# Patient Record
Sex: Female | Born: 1970 | Race: Black or African American | Hispanic: No | Marital: Single | Smoking: Never smoker
Health system: Southern US, Community
[De-identification: ages and names within clinical notes are randomized; demographics above are authoritative.]

## PROBLEM LIST (undated history)

## (undated) DIAGNOSIS — F419 Anxiety disorder, unspecified: Secondary | ICD-10-CM

## (undated) DIAGNOSIS — F329 Major depressive disorder, single episode, unspecified: Secondary | ICD-10-CM

## (undated) DIAGNOSIS — K219 Gastro-esophageal reflux disease without esophagitis: Secondary | ICD-10-CM

## (undated) DIAGNOSIS — I10 Essential (primary) hypertension: Secondary | ICD-10-CM

## (undated) DIAGNOSIS — F32A Depression, unspecified: Secondary | ICD-10-CM

## (undated) DIAGNOSIS — B009 Herpesviral infection, unspecified: Secondary | ICD-10-CM

## (undated) HISTORY — PX: LASIK: SHX215

## (undated) HISTORY — PX: NASAL SEPTUM SURGERY: SHX37

---

## 1997-09-27 ENCOUNTER — Other Ambulatory Visit: Admission: RE | Admit: 1997-09-27 | Discharge: 1997-09-27 | Payer: Self-pay | Admitting: Obstetrics & Gynecology

## 1997-09-27 ENCOUNTER — Other Ambulatory Visit: Admission: RE | Admit: 1997-09-27 | Discharge: 1997-09-27 | Payer: Self-pay | Admitting: Obstetrics and Gynecology

## 1999-01-08 ENCOUNTER — Other Ambulatory Visit: Admission: RE | Admit: 1999-01-08 | Discharge: 1999-01-08 | Payer: Self-pay | Admitting: Obstetrics & Gynecology

## 2000-01-03 ENCOUNTER — Emergency Department (HOSPITAL_COMMUNITY): Admission: EM | Admit: 2000-01-03 | Discharge: 2000-01-03 | Payer: Self-pay | Admitting: Internal Medicine

## 2000-01-03 ENCOUNTER — Encounter: Payer: Self-pay | Admitting: Emergency Medicine

## 2000-01-15 ENCOUNTER — Encounter: Admission: RE | Admit: 2000-01-15 | Discharge: 2000-02-12 | Payer: Self-pay | Admitting: Orthopedic Surgery

## 2000-02-22 ENCOUNTER — Encounter: Payer: Self-pay | Admitting: Family Medicine

## 2000-02-22 ENCOUNTER — Ambulatory Visit (HOSPITAL_COMMUNITY): Admission: RE | Admit: 2000-02-22 | Discharge: 2000-02-22 | Payer: Self-pay | Admitting: Family Medicine

## 2002-04-08 ENCOUNTER — Emergency Department (HOSPITAL_COMMUNITY): Admission: EM | Admit: 2002-04-08 | Discharge: 2002-04-08 | Payer: Self-pay | Admitting: Emergency Medicine

## 2002-04-22 ENCOUNTER — Other Ambulatory Visit: Admission: RE | Admit: 2002-04-22 | Discharge: 2002-04-22 | Payer: Self-pay | Admitting: Obstetrics and Gynecology

## 2004-11-13 ENCOUNTER — Other Ambulatory Visit: Admission: RE | Admit: 2004-11-13 | Discharge: 2004-11-13 | Payer: Self-pay | Admitting: Obstetrics and Gynecology

## 2005-03-16 ENCOUNTER — Ambulatory Visit (HOSPITAL_COMMUNITY): Admission: RE | Admit: 2005-03-16 | Discharge: 2005-03-16 | Payer: Self-pay | Admitting: Obstetrics and Gynecology

## 2005-03-16 ENCOUNTER — Encounter (INDEPENDENT_AMBULATORY_CARE_PROVIDER_SITE_OTHER): Payer: Self-pay | Admitting: *Deleted

## 2005-11-14 ENCOUNTER — Other Ambulatory Visit: Admission: RE | Admit: 2005-11-14 | Discharge: 2005-11-14 | Payer: Self-pay | Admitting: Obstetrics and Gynecology

## 2006-12-02 ENCOUNTER — Ambulatory Visit (HOSPITAL_COMMUNITY): Admission: RE | Admit: 2006-12-02 | Discharge: 2006-12-02 | Payer: Self-pay | Admitting: Obstetrics and Gynecology

## 2007-01-16 ENCOUNTER — Inpatient Hospital Stay (HOSPITAL_COMMUNITY): Admission: AD | Admit: 2007-01-16 | Discharge: 2007-01-16 | Payer: Self-pay | Admitting: Obstetrics and Gynecology

## 2007-07-17 ENCOUNTER — Observation Stay (HOSPITAL_COMMUNITY): Admission: AD | Admit: 2007-07-17 | Discharge: 2007-07-18 | Payer: Self-pay | Admitting: Obstetrics and Gynecology

## 2007-09-01 ENCOUNTER — Inpatient Hospital Stay (HOSPITAL_COMMUNITY): Admission: AD | Admit: 2007-09-01 | Discharge: 2007-09-01 | Payer: Self-pay | Admitting: Obstetrics and Gynecology

## 2007-09-01 ENCOUNTER — Inpatient Hospital Stay (HOSPITAL_COMMUNITY): Admission: AD | Admit: 2007-09-01 | Discharge: 2007-09-04 | Payer: Self-pay | Admitting: Obstetrics and Gynecology

## 2007-09-08 ENCOUNTER — Inpatient Hospital Stay (HOSPITAL_COMMUNITY): Admission: AD | Admit: 2007-09-08 | Discharge: 2007-09-09 | Payer: Self-pay | Admitting: Obstetrics and Gynecology

## 2009-07-21 ENCOUNTER — Encounter: Admission: RE | Admit: 2009-07-21 | Discharge: 2009-07-21 | Payer: Self-pay | Admitting: Internal Medicine

## 2009-08-03 ENCOUNTER — Encounter: Admission: RE | Admit: 2009-08-03 | Discharge: 2009-08-03 | Payer: Self-pay | Admitting: Obstetrics and Gynecology

## 2010-06-10 ENCOUNTER — Encounter: Payer: Self-pay | Admitting: Obstetrics and Gynecology

## 2010-10-02 NOTE — H&P (Signed)
NAME:  Maria Reid, Maria Reid NO.:  [id number]   MEDICAL RECORD NO.:  [id number]          PATIENT TYPE:  OBV   LOCATION:  9155                          [facility]   PHYSICIAN:  Osborn Coho, M.D.   DATE OF BIRTH:  1971-01-07   DATE OF ADMISSION:  07/17/2007  DATE OF DISCHARGE:                              HISTORY & PHYSICAL   Maria Reid is a 40 year old gravida 2, para 0-0-1-0 at 33-6/7 weeks who  presented to maternity admissions unit complaining of nausea, vomiting  and diarrhea since 2 a.m. yesterday.  She has been unable to keep any  food or fluids down.  She had a temperature of 100.4 at home.  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 She  reported occasional sharp pains and reported positive fetal movement.   While in maternity admissions unit, she did receive IV hydration with  fluids with Phenergan.  However, she still maintained several episodes  of vomiting.  She also had some fetal tachycardia although the fetal  heart rate was reactive.  In light of these findings, the decision was  made to findings, the decision was made to admit her for 23-hour  observation.   Pregnancy has been remarkable for:  1. Allergies to PENICILLIN, SULFA and ASPIRIN.  2. History of cryosurgery.  3. Advanced maternal age.  4. History of asthma.  5. Rubella equivocal.  6. History of genital herpes.  No recent or current lesions.  7. Elevated 1-hour GTT with one abnormal 3-hours values.  However, the      patient has been monitoring her CBG's at home and they have been      within normal limits.   PRENATAL LABS:  Blood type is A+, Rh antibody negative, VDRL  nonreactive, rubella is equivocal.  Hepatitis B surface antigen is  negative.  Sickle cell test was negative.  HIV nonreactive.  Hemoglobin  upon entering the practice is 11.9, was 11.6 at 28 weeks.  A 4-hour  Glucola was 161.  Her 3-hour GTT had one abnormal values.  TSH was  1.854.  This was done secondary to a lack of awareness and slurring her  words.   HISTORY OF PRESENT PREGNANCY:  The patient entered care at approximately  7 weeks.  She had an ultrasound at that time.  This was done at 7 weeks.  There were some polyps seen.  There was a follow-up ultrasound done at 7  weeks showing an [facility] of September 02, 2007 which was consistent with [facility] of  August 29, 2007 by LMP.  At her new OB visit,  she was treated for BV.  She had another viability ultrasound at that time.  She declined first  trimester screening.  She has sinus congestion early pregnancy.  She had  an anatomy screen at 19 weeks showing normal growth.  There were some  limitations of cardiac anatomy.  Follow-up was planned in two weeks with  all follow-up seen.  She had some groin pain at 21 weeks.  At 28 weeks,  she saw Dr. Su Hilt  and had a Glucola.  At that time she complained of a  lack of awareness at times, episodically, and friends saying the patient  was slurring her words.  TSH was done that was normal.  Referral to a  neurologist was accomplished. One-hour Glucola was at 161 at that time  and a 3-hour GTT was done that had one abnormal value.  She had a  neurological appointment on February 16.  Their work-up was negative.  They had some recommendations for an MRI after delivery to rule out  transient global amnesia.  The patient also has been monitoring her  CBGs.  All have been normal and she has been following her mother's  diabetic diet to keep her blood sugars stable.  She was measuring  slightly size larger than dates and plan was made for ultrasound at her  next visit.  She then presented today with the history of almost 24  hours of nausea, vomiting and diarrhea.   OBSTETRICAL HISTORY:  In 2005 she had a 7-8 week termination of  pregnancy.   MEDICAL HISTORY:  1. In 1990 she had an abnormal Pap smear.  She had cryosurgery with      warts and a colposcopy.  Paps have been normal  since then.  2.  She      had Chlamydia in 1990.  2. She was diagnosed with herpes in 1992 and has had outbreaks two to      three times a year.  3. She has had  Trichomonas in 1990.  4. 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  5. She reports usual childhood illnesses.  6. Frequent yeast infections.  7. She does have a history of a questionable heart murmur.  However,      this has not been noted during her pregnancy.  8. Since age 25 she has had asthma and has been followed by Dr.      Regent Callas, but currently is not using any routine medications.  9. She was noted in 2000 to have some depression.   PAST SURGICAL HISTORY:  1. Surgical history includes wisdom teeth in 2008.  2. Termination of pregnancy in 2005 with a D&C.  3. She had polyps removed in 2006.   ALLERGIES:  She is allergic to PENICILLIN, SULFA, ASPIRIN.   FAMILY HISTORY:  Her father had an MI and a stent placed.  Her father  and maternal grandmother have chronic hypertension.  Her maternal  grandmother had COPD.  Her mother, father and maternal grandmother have  diabetes.  Her maternal grandmother had seizures and a stroke.  Her  maternal grandmother had rheumatoid arthritis.  Paternal grandfather had  prostate.  Her sister, maternal grandmother, maternal grandmother all  have depression.   GENETIC HISTORY:  Remarkable for the patient's age of 71.  The father of  the baby's sister is a deaf mute.  The patient's first cousin is  mentally retarded.  The patient's aunt had twins and father of baby is a  twin.   SOCIAL HISTORY:  The patient is single.  Father of baby has been  involved but is not present with her today.  His name is Social research officer, government.  The patient is of the [employer] faith.  She is Tree surgeon.  She is  graduate educated.  She is a Engineer, civil (consulting).  Her partner has a 1 year of  college.  He is employed in business.  She has been followed by the  certified nurse midwife service [facility].  She denies any   alcohol, drug or tobacco use during this pregnancy.   PHYSICAL EXAMINATION:  VITAL SIGNS:  Stable.  Pulse is 114 to 119.  Respiration 20, temperature is 98.6.  Blood pressure was 110/60.  Fetal  heart tones are reactive.  However, there are segments of tachycardia.  There is some occasional mild variables noted.  These are not  repetitive.  The  patient initially had contractions every 3 to 6  minutes, but a negative spontaneous CST.  She was minimally aware of the  those.  Fetal fibronectin was done which was negative.  PELVIC:  Cervix was long and closed with the vertex at -2 station.  ABDOMEN:  Soft and nontender.  No rebound or guarding.  Negative CVA  tenderness noted.  EXTREMITIES:  Deep tendon reflexes are 2+ without clonus.  There is a  trace edema noted.   LABORATORY DATA:  Urinalysis shows a specific gravity greater than  1.030, greater than 80 ketones and 30 mg of protein.  CBC shows a  hemoglobin 11.6, hematocrit of 33.8.  White blood cell count of 13.7 and  platelet count of 205.  Neutrophil count is 88%.  Comprehensive  metabolic panel shows all results within normal limits except for a  mildly low potassium at 3.2.  Amylase and lipase are currently pending.   ASSESSMENT:  1. Intrauterine pregnancy at 33-6/7 weeks.  2. Probable viral syndrome.  3. Sporadic fetal tachycardia  4. Maternal tachycardia, probably secondary to dehydration.   PLAN:  1. Admit to antenatal unit per Dr. Su Hilt consult for 23-hour      observation.  2. Continue IV hydration with LR at 250 mL per hour.  3. Zofran 4 mg IV q.8 h.  4. Maintain clear liquids if tolerated.  5. Will reevaluate in the morning.      Renaldo Reel Emilee Hero, C.N.M.      Osborn Coho, M.D.  Electronically Signed    VLL/MEDQ  D:  07/17/2007  T:  07/18/2007  Job:  [id number]

## 2010-10-02 NOTE — H&P (Signed)
NAME:  Maria Reid, Maria Reid NO.:  [id number]   MEDICAL RECORD NO.:  [id number]          PATIENT TYPE:  INP   LOCATION:  9166                          [facility]   PHYSICIAN:  Osborn Coho, M.D.   DATE OF BIRTH:  05-11-1971   DATE OF ADMISSION:  09/01/2007  DATE OF DISCHARGE:                              HISTORY & PHYSICAL   This is a 40 year old gravida 2, para 0-0-1-0 at 40-3/7 weeks who  presents for labor evaluation. She was seen earlier this morning with no  change and service and sent home.  She came back later on and over the  course of 4 hours progressed from 1-2 cm to 2+ centimeters. Biophysical  profile was 6/8 with amniotic fluid pocket of 10.12.  Pregnancy has been  followed by the nurse midwife service, but the patient now requests  physician delivery and her pregnancy has been remarkable for:  1. History of abnormal Pap  2. History of HSV II.  3. History of HPV.  4. History of asthma.  5. Equivocal rubella.  6. History of cryosurgery.  7. AMA.  8. Group B strep negative.   ALLERGIES:  PENICILLIN, SULFA AND ASPIRIN.   OB HISTORY:  Is remarkable for an elective abortion in 2005.   MEDICAL HISTORY:  1. Is remarkable for abnormal Pap with cryosurgery in 1990.  2. History of chlamydia in 1990.  3. HSV II since 1992.  4. Trichomonas in 1990.  5. Childhood varicella.  6. History of asthma.  7. History of some kind of psychiatric disease in the year 2000.   SURGICAL HISTORY:  1. Is remarkable for elective abortion.  2. Wisdom teeth.   FAMILY HISTORY:  Is remarkable for father with myocardial infarction,  father and mother with hypertension, grandmother with COPD, mother and  father and grandmother with diabetes, grandmother with seizures and  arthritis, grandfather with prostate cancer.   GENETIC HISTORY:  Is remarkable for father of the baby, sister with  deafness. Cousin with mental retardation and aunt with twins.   SOCIAL HISTORY:  The  patient is single.  Father of baby Garret Reddish  is not currently present with the patient.  The patient works as a  Engineer, civil (consulting).  She is of the [employer].  She denies any alcohol, tobacco or  drug use.   PRENATAL LABS:  Hemoglobin 11.9, platelets 292.  Hepatitis negative, RPR  nonreactive.  Blood type A+, antibody screen negative, sickle cell  negative, rubella equivocal, HIV negative.   HISTORY OF CURRENT PREGNANCY:  The patient entered care at [redacted] weeks  gestation.  She was seen for first trimester spotting and ultrasound was  done at 7 weeks and was normal. She was treated for BV at 11 weeks. She  declined first trimester screen.  Anatomy ultrasound at 19 weeks was  normal. She had a fall at 21 weeks which gave her groin pain. Glucola  was elevated at 28 weeks; 3-hour GTT showed one abnormal value. She had  an episode of slurred speech at 28 weeks and saw neurologist for that  who recommended MRI.  She  has swelling in her legs at 34 weeks and  stopped work. Group B strep was negative at term.   OBJECTIVE:  VITAL SIGNS: Stable, afebrile.  HEENT: Within normal limits.  Thyroid normal not enlarged.  CHEST: Clear to auscultation.  HEART: Regular rate and rhythm.  ABDOMEN: Gravid, vertex Leopold's. EFM shows reactive fetal heart rate  with contractions every 3-4 minutes.  Cervix is 2+ centimeters,  completely effaced, -1, -2 station with a vertex presentation. BPP is  6/8. Amniotic fluid pocket 10.12.  PELVIC: Negative for HSV lesions.  EXTREMITIES:  Within normal limits.   ASSESSMENT:  1. Intrauterine pregnancy at 40-3/7 weeks.  2. Early labor can.   PLAN:  1. Admit to birthing suites per Dr. Su Hilt.  2. Routine MD orders.  3. Analgesia p.r.n.      Marie L. Williams, C.N.M.      Osborn Coho, M.D.  Electronically Signed    MLW/MEDQ  D:  09/01/2007  T:  09/01/2007  Job:  [id number]

## 2010-10-02 NOTE — Discharge Summary (Signed)
NAMECHARLESTON, Maria Reid NO.:  [id number]   MEDICAL RECORD NO.:  [id number]          PATIENT TYPE:  OBV   LOCATION:  9155                          [facility]   PHYSICIAN:  Osborn Coho, M.D.   DATE OF BIRTH:  February 19, 1971   DATE OF ADMISSION:  07/17/2007  DATE OF DISCHARGE:                               DISCHARGE SUMMARY   ADMISSION DIAGNOSES:  1. Intrauterine pregnancy at 33 weeks and 6 days.  2. Gastroenteritis.  3. Dehydration.   DISCHARGE DIAGNOSES:  1. Intrauterine pregnancy at 69 and 0/7th weeks.  2. Dehydration,  resolved.  3. Gastroenteritis, resolved.   PROCEDURE:  IV fluid hydration.   HOSPITAL COURSE:  Ms. Goin is a 40 year old gravida 2, para 0-0-1-0,  who presents to [facility]U at 40 and 7th weeks with complaints of persistent  nausea and vomiting.  Patient's pregnancy remarkable for:  1. Advanced maternal age.  2. ALLERGIES TO PENICILLIN, SULFA, ASPIRIN.  3. History of cervical cryo surgery.  4. History of asthma.  5. Rubella equivocal.  6. History of genital Herpes.  7. Increased 1 hour glucose tolerance test with 1 abnormal 3 hour      value.   Upon presentation to Maternity Admissions, patient with recurrent nausea  and vomiting since 2 a.m.,  July 17, 2007. Patient with no known  exposure. 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 The patient  reports her fetus had been moving normally and denied uterine  contractions, leakage, or fluid. On admission, the patient's urine was  significant for a specific gravity greater than 1.030, greater than 80  mg of ketones/dL, 30 mg of protein/dL as well. The patient was given IV  fluids in Maternity Admissions as well as Zofran V. Fetal fibronectin  was also obtained which was negative due to occasional contractions  noted on ESM. Fetal heart rate was reactive while in Maternity  Admissions with occasional variables. The patient's CBC with white count  of 13.7, hemoglobin 11.6,  platelets 205,000. Despite IV fluids and  Zofran, patient with persistent vomiting, therefore was admitted for 23-  hour observation and continued IV fluids. Again, as noted above, fetal  fibronectin obtained and negative. Upon admission, the patient's  metabolic panel with sodium 137, potassium 3.2, liver functions within  normal limits. Amylase and lipase were also obtained and were within  normal limits as well. On hospital day 1, patient reported with no  further vomiting since admission. The patient reported that she did have  1 loose stool; however, no recurrent diarrhea. The patient reported  feeling much better. Due to variable decelerations noted on EFM during  the night, biophysical profile and AFI were done.  Amniotic fluid index  1.72 cm and the biophysical profile 6 out of 8 with 2 off for fetal  breathing motion. Throughout the morning, fetal heart rate remained  reactive with variable decelerations resolved. No further uterine  contractions noted on toto __________ . The patient tolerated a clear  liquid breakfast and a regular diet at lunchtime and requesting  discharge. It was felt that patient had received full benefit of  her  stay and was discharged to home.   DISCHARGE INSTRUCTIONS PER ANTEPARTUM DISCHARGE HANDOUT:  Preterm labor  instructions reviewed as well as patient was encouraged to maintain a  bland diet until complete resolution of GI signs and symptoms.   DISCHARGE MEDICATIONS:  1. Prenatal vitamin daily.  2. 10 mg of Singulair.  3. Acyclovir 400 mg b.i.d.  4. Prozac 20 mg q.h.s.  5. Claritin q.a.m.  6. Benadryl 25 mg q.h.s.  7. Zantac 150 mg b.i.d.   DISCHARGE FOLLOWUP:  Patient to return to the office for her regularly  scheduled return OB visit within the next week with ultrasound for  growth as scheduled. The patient was instructed to call if she has  questions or concerns prior to the next visit. The patient was also  instructed to call if she has  fever or recurrent symptoms.      Rhona Leavens, CNM      Osborn Coho, M.D.  Electronically Signed    NOS/MEDQ  D:  07/18/2007  T:  07/19/2007  Job:  [id number]

## 2010-10-05 NOTE — H&P (Signed)
NAME:  Maria Reid, SOBERANIS NO.:  [id number]   MEDICAL RECORD NO.:  [id number]          PATIENT TYPE:  AMB   LOCATION:  SDC                           [facility]   PHYSICIAN:  Janine Limbo, M.D.DATE OF BIRTH:  [birth year]   DATE OF ADMISSION:  03/16/2005  DATE OF DISCHARGE:                                HISTORY & PHYSICAL   HISTORY OF PRESENT ILLNESS:  Maria Reid is a 40 year old female, para 0-0-1-  0, who presents for hysteroscopy with dilatation and curettage.  The patient  has been followed at the [facility] and Gynecology division  of [facility].  The patient has a history of  menorrhagia.  A saline infusion sonogram was performed that showed an  endometrial mass consistent with a polyp.   OBSTETRICAL HISTORY:  The patient had an elective pregnancy termination  performed in January 2006.   PAST MEDICAL HISTORY:  The patient denies hypertension and diabetes.  She  has a history of asthma, and she has depression.   CURRENT MEDICATIONS:  1.  Singulair 10 mg q.h.s.  2.  Claritin 10 mg daily.  3.  Nasal spray, Nasocort, weekly allergy shots, multivitamins, and BC      Powders.   DRUG ALLERGIES:  The patient reports being allergic to PENICILLIN, ASPIRIN,  and SULFA medications.   SOCIAL HISTORY:  The patient denies cigarette use and recreational drug use  other than social alcohol.   REVIEW OF SYSTEMS:  Noncontributory.   FAMILY HISTORY:  The patient's grandmother has diabetes and seizures.  The  patient's mother has ulcers and hypertension.  The patient's father has  rheumatic fever, high blood pressure, and heart disease.   PHYSICAL EXAMINATION:  VITAL SIGNS:  Weight is 186 pounds.  Height is 5 feet  7 inches.  HEENT:  Within normal limits.  CHEST:  Clear.  HEART:  Regular rate and rhythm.  BREASTS:  Without masses.  ABDOMEN:  Nontender.  NEUROLOGIC:  Grossly normal.  EXTREMITIES:  Grossly normal.   PELVIC EXAM:  External genitalia is normal.  The vagina is normal.  The  cervix is nontender.  The uterus is normal size, shape, and consistency.  Adnexa:  No masses.   ASSESSMENT:  1.  Irregular bleeding.  2.  Endometrial polyp.   PLAN:  The patient will undergo a hysteroscopy with dilatation and  curettage.  She understands the indications for her procedure and she  accepts the risks of, but not limited to, anesthetic complications,  bleeding, infections, and possible damage to the surrounding organs.      Janine Limbo, M.D.  Electronically Signed     AVS/MEDQ  D:  03/15/2005  T:  03/15/2005  Job:  [id number]   cc:   Renaye Rakers, M.D.  Fax: [phone]

## 2010-10-05 NOTE — Op Note (Signed)
[name], YIM NO.:  [id number]   MEDICAL RECORD NO.:  [id number]          PATIENT TYPE:  AMB   LOCATION:  SDC                           [facility]   PHYSICIAN:  Maria Reid, M.D.DATE OF BIRTH:  [birth year]   DATE OF PROCEDURE:  03/16/2005  DATE OF DISCHARGE:                                 OPERATIVE REPORT   PREOPERATIVE DIAGNOSES:  1.  Irregular uterine bleeding.  2.  Endometrial polyp.   POSTOPERATIVE DIAGNOSES:  1.  Irregular uterine bleeding.  2.  Endometrial polyp.   PROCEDURE:  1.  Hysteroscopy.  2.  Hysteroscopic resection of endometrial polyp.  3.  Dilatation and curettage.   SURGEON:  Dr. Leonard Reid.   ANESTHETIC:  General.   DISPOSITION:  Maria Reid is a 40 year old female, para 0-0-1-0, who  presents with the above-mentioned diagnoses. The patient understands the  indications for procedure, and she accepts the risk of, but not limited to,  anesthetic complications, bleeding, infection, and possible damage to  surrounding organs.   FINDINGS:  The uterus sounded to 8.5 cm. There was an endometrial polyp  present at the fundus of the uterus that measured less than a centimeter in  size. No other pathology was noted. No adnexal masses were appreciated on  exam under anesthesia.   PROCEDURE:  The the patient was taken to the operating room where a general  anesthetic was given. The patient's lower abdomen, perineum, and vagina were  prepped with multiple layers of Betadine. The bladder was drained of urine.  Examination under anesthesia was performed. The patient was then sterilely  draped. A paracervical block was placed using 10 cc of half percent Marcaine  with epinephrine. An endocervical curettage was then performed. The uterus  sounded to 8.5 cm. The cervix was gradually dilated. The diagnostic  hysteroscope was inserted, and the cavity was carefully inspected. Pictures  were taken. The patient was noted to  have a small polyp measuring less than  a centimeter in size at the fundus of the uterus. The diagnostic  hysteroscope was removed, and the cervix was dilated even further. The  operative hysteroscope was then inserted, and a single loop was used to  resect the polyp from the fundus of the uterus. Care was taken not to  penetrate the uterus. Hemostasis was adequate. The cavity was then curetted  using a medium sharp curette. The cavity was felt to be clean at the end of  our procedure. All incisions were removed. Again, hemostasis was confirmed.  Repeat exam was performed, and the uterus was noted to be firm and without  apparent damage. Sponge, needle, and instrument counts were correct on two  occasions. The estimated blood loss for the procedure was 20 cc. The patient  tolerated her procedure well. The fluid deficit was 150 cc, although there  was a large amount of fluid noted on the floor and also a large amount of  fluid noted on the operator's gown. The patient was noted to be stable when  she was taken to the recovery room.   FOLLOW-UP INSTRUCTIONS:  The  patient was given a prescription for Vicodin,  and she will take 1 tablet or 2 tablets every 4 hours as needed for pain.  She will return to see Dr. Stefano Reid in three weeks for follow-up  examination. She was given a copy of the postoperative instruction sheet as  prepared by the [facility] for patients with undergone a  dilatation and curettage.      Maria Reid, M.D.  Electronically Signed     AVS/MEDQ  D:  03/16/2005  T:  03/16/2005  Job:  [id number]

## 2011-02-08 LAB — URINALYSIS, ROUTINE W REFLEX MICROSCOPIC
Glucose, UA: NEGATIVE
Leukocytes, UA: NEGATIVE
Nitrite: NEGATIVE
Specific Gravity, Urine: >1.03 — ABNORMAL HIGH
pH: 5.5

## 2011-02-08 LAB — COMPREHENSIVE METABOLIC PANEL
Alkaline Phosphatase: 124 — ABNORMAL HIGH
BUN: 6
Creatinine, Ser: 0.84
Glucose, Bld: 166 — ABNORMAL HIGH
Potassium: 3.2 — ABNORMAL LOW
Total Protein: 5.6 — ABNORMAL LOW

## 2011-02-08 LAB — DIFFERENTIAL
Basophils Absolute: 0
Basophils Relative: 0
Lymphocytes Relative: 7 — ABNORMAL LOW
Monocytes Relative: 6
Neutro Abs: 12 — ABNORMAL HIGH
Neutrophils Relative %: 88 — ABNORMAL HIGH

## 2011-02-08 LAB — CBC
HCT: 33.8 — ABNORMAL LOW
Hemoglobin: 11.6 — ABNORMAL LOW
MCHC: 34.2
MCV: 96.1
RDW: 13.3

## 2011-02-08 LAB — URINE MICROSCOPIC-ADD ON

## 2011-02-08 LAB — AMYLASE: Amylase: 115

## 2011-02-12 LAB — CBC
HCT: 34.2 — ABNORMAL LOW
HCT: 38
Hemoglobin: 11.8 — ABNORMAL LOW
Hemoglobin: 13
MCHC: 34.2
MCV: 96.6
RBC: 4.1
RBC: 3.93
WBC: 20.7 — ABNORMAL HIGH
WBC: 18.3 — ABNORMAL HIGH

## 2011-02-12 LAB — LACTATE DEHYDROGENASE: LDH: 166

## 2011-02-12 LAB — COMPREHENSIVE METABOLIC PANEL
ALT: 35
CO2: 25
Calcium: 9.3
Creatinine, Ser: 1.02
GFR calc non Af Amer: >60
Glucose, Bld: 88

## 2011-02-12 LAB — URINALYSIS, ROUTINE W REFLEX MICROSCOPIC
Glucose, UA: NEGATIVE
Leukocytes, UA: NEGATIVE
Nitrite: NEGATIVE
pH: 5.5

## 2011-02-12 LAB — RPR: RPR Ser Ql: NONREACTIVE

## 2011-02-12 LAB — URINE MICROSCOPIC-ADD ON

## 2011-03-01 LAB — GC/CHLAMYDIA PROBE AMP, GENITAL: GC Probe Amp, Genital: NEGATIVE

## 2011-03-01 LAB — WET PREP, GENITAL

## 2011-06-29 DIAGNOSIS — J45909 Unspecified asthma, uncomplicated: Secondary | ICD-10-CM | POA: Insufficient documentation

## 2011-06-29 DIAGNOSIS — B009 Herpesviral infection, unspecified: Secondary | ICD-10-CM | POA: Insufficient documentation

## 2011-06-29 DIAGNOSIS — N840 Polyp of corpus uteri: Secondary | ICD-10-CM | POA: Insufficient documentation

## 2011-09-17 ENCOUNTER — Encounter (HOSPITAL_COMMUNITY): Payer: Self-pay | Admitting: Emergency Medicine

## 2011-09-17 ENCOUNTER — Emergency Department (HOSPITAL_COMMUNITY)
Admission: EM | Admit: 2011-09-17 | Discharge: 2011-09-17 | Disposition: A | Discharge status: Home / Self Care | Payer: Managed Care, Other (non HMO) | Source: Home / Self Care | Attending: Family Medicine | Admitting: Family Medicine

## 2011-09-17 DIAGNOSIS — N760 Acute vaginitis: Secondary | ICD-10-CM

## 2011-09-17 LAB — WET PREP, GENITAL: Yeast Wet Prep HPF POC: NONE SEEN

## 2011-09-17 LAB — POCT URINALYSIS DIP (DEVICE)
Bilirubin Urine: NEGATIVE
Glucose, UA: NEGATIVE mg/dL
Ketones, ur: NEGATIVE mg/dL
Leukocytes, UA: NEGATIVE
Nitrite: NEGATIVE
pH: 5.5 (ref 5.0–8.0)

## 2011-09-17 NOTE — Discharge Instructions (Signed)
We will call with test results and treat as indicated °

## 2011-09-17 NOTE — ED Provider Notes (Signed)
History     CSN: [id number]  Arrival date & time 09/17/11  4098   First MD Initiated Contact with Patient 09/17/11 [phone]      Chief Complaint  Patient presents with  . Vaginal Discharge    (Consider location/radiation/quality/duration/timing/severity/associated sxs/prior treatment) Patient is a 41 y.o. female presenting with vaginal discharge. The history is provided by the patient.  Vaginal Discharge This is a new problem. The current episode started more than 1 week ago. The problem has not changed since onset.Pertinent negatives include no chest pain, no abdominal pain, no headaches and no shortness of breath. Associated symptoms comments: H/o BV years ago..    Past Medical History  Diagnosis Date  . Asthma     History reviewed. No pertinent past surgical history.  No family history on file.  History  Substance Use Topics  . Smoking status: Never Smoker   . Smokeless tobacco: Not on file  . Alcohol Use: Yes    OB History    Grav Para Term Preterm Abortions TAB SAB Ect Mult Living                  Review of Systems  Constitutional: Negative.   Respiratory: Negative for shortness of breath.   Cardiovascular: Negative for chest pain.  Gastrointestinal: Negative for abdominal pain.  Genitourinary: Positive for vaginal discharge. Negative for dysuria, urgency, frequency, vaginal bleeding, vaginal pain and pelvic pain.  Neurological: Negative for headaches.    Allergies  Aspirin; Penicillins; and Sulfa drugs cross reactors  Home Medications   Current Outpatient Rx  Name Route Sig Dispense Refill  . ALBUTEROL SULFATE HFA 108 (90 BASE) MCG/ACT IN AERS Inhalation Inhale 2 puffs into the lungs every 6 (six) hours as needed.    Marland Kitchen CALCIUM CARBONATE ANTACID 500 MG PO CHEW Oral Chew 1 tablet by mouth daily.    Marland Kitchen DIPHENHYDRAMINE HCL 25 MG PO TABS Oral Take 25 mg by mouth every 6 (six) hours as needed.    Marland Kitchen LEVONORGESTREL 20 MCG/24HR IU IUD Intrauterine 1 each by  Intrauterine route once.    Marland Kitchen MONTELUKAST SODIUM 10 MG PO TABS Oral Take 10 mg by mouth at bedtime.      BP 116/81  Pulse 80  Temp(Src) 98.5 F (36.9 C) (Oral)  Resp 16  SpO2 100%  LMP 08/28/2011  Physical Exam  Nursing note and vitals reviewed. Constitutional: She appears well-developed and well-nourished.  Abdominal: Soft. Bowel sounds are normal. There is no tenderness.  Genitourinary: Vagina normal and uterus normal. No vaginal discharge found.  Skin: Skin is warm and dry.    ED Course  Procedures (including critical care time)  Labs Reviewed  POCT URINALYSIS DIP (DEVICE) - Abnormal; Notable for the following:    Hgb urine dipstick MODERATE (*)    All other components within normal limits  POCT PREGNANCY, URINE  GC/CHLAMYDIA PROBE AMP, GENITAL  WET PREP, GENITAL   No results found.   1. Vaginitis       MDM          Linna Hoff, MD 09/17/11 [phone]

## 2011-09-17 NOTE — ED Notes (Signed)
Pt here with c/o vag clear d/c with odor,dk concentrated urine that started last week.no fever, back or abd pain noted.pt states she has past BV AND TREATED by doctor with boric acid supp which cleared infection up

## 2011-09-18 LAB — GC/CHLAMYDIA PROBE AMP, GENITAL
Chlamydia, DNA Probe: NEGATIVE
GC Probe Amp, Genital: NEGATIVE

## 2013-05-29 ENCOUNTER — Encounter (HOSPITAL_COMMUNITY): Payer: Self-pay | Admitting: Emergency Medicine

## 2013-05-29 ENCOUNTER — Emergency Department (HOSPITAL_COMMUNITY)
Admission: EM | Admit: 2013-05-29 | Discharge: 2013-05-29 | Disposition: A | Discharge status: Home / Self Care | Payer: Managed Care, Other (non HMO) | Attending: Emergency Medicine | Admitting: Emergency Medicine

## 2013-05-29 DIAGNOSIS — Z79899 Other long term (current) drug therapy: Secondary | ICD-10-CM | POA: Insufficient documentation

## 2013-05-29 DIAGNOSIS — K219 Gastro-esophageal reflux disease without esophagitis: Secondary | ICD-10-CM | POA: Insufficient documentation

## 2013-05-29 DIAGNOSIS — R071 Chest pain on breathing: Secondary | ICD-10-CM | POA: Insufficient documentation

## 2013-05-29 DIAGNOSIS — M549 Dorsalgia, unspecified: Secondary | ICD-10-CM | POA: Insufficient documentation

## 2013-05-29 DIAGNOSIS — R51 Headache: Secondary | ICD-10-CM | POA: Insufficient documentation

## 2013-05-29 DIAGNOSIS — IMO0001 Reserved for inherently not codable concepts without codable children: Secondary | ICD-10-CM | POA: Insufficient documentation

## 2013-05-29 DIAGNOSIS — Y92410 Unspecified street and highway as the place of occurrence of the external cause: Secondary | ICD-10-CM | POA: Insufficient documentation

## 2013-05-29 DIAGNOSIS — Y9389 Activity, other specified: Secondary | ICD-10-CM | POA: Insufficient documentation

## 2013-05-29 DIAGNOSIS — J45909 Unspecified asthma, uncomplicated: Secondary | ICD-10-CM | POA: Insufficient documentation

## 2013-05-29 DIAGNOSIS — Z880 Allergy status to penicillin: Secondary | ICD-10-CM | POA: Insufficient documentation

## 2013-05-29 DIAGNOSIS — IMO0002 Reserved for concepts with insufficient information to code with codable children: Secondary | ICD-10-CM | POA: Insufficient documentation

## 2013-05-29 HISTORY — DX: Gastro-esophageal reflux disease without esophagitis: K21.9

## 2013-05-29 MED ORDER — IBUPROFEN 800 MG PO TABS: 800.0000 mg | ORAL_TABLET | Freq: Three times a day (TID) | ORAL | Status: AC

## 2013-05-29 MED ORDER — CYCLOBENZAPRINE HCL 10 MG PO TABS: 10.0000 mg | ORAL_TABLET | Freq: Two times a day (BID) | ORAL | Status: DC | PRN
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
## 2013-05-29 NOTE — ED Provider Notes (Signed)
Medical screening examination/treatment/procedure(s) were performed by non-physician practitioner and as supervising physician I was immediately available for consultation/collaboration.   Shanna CiscoMegan E Yandriel Boening, MD 05/29/13 [phone]

## 2013-07-06 ENCOUNTER — Encounter (HOSPITAL_BASED_OUTPATIENT_CLINIC_OR_DEPARTMENT_OTHER): Payer: Self-pay | Admitting: Emergency Medicine

## 2013-07-06 ENCOUNTER — Emergency Department (HOSPITAL_BASED_OUTPATIENT_CLINIC_OR_DEPARTMENT_OTHER)
Admission: EM | Admit: 2013-07-06 | Discharge: 2013-07-06 | Disposition: A | Discharge status: Home / Self Care | Payer: Managed Care, Other (non HMO) | Attending: Emergency Medicine | Admitting: Emergency Medicine

## 2013-07-06 DIAGNOSIS — R110 Nausea: Secondary | ICD-10-CM | POA: Insufficient documentation

## 2013-07-06 DIAGNOSIS — K219 Gastro-esophageal reflux disease without esophagitis: Secondary | ICD-10-CM | POA: Insufficient documentation

## 2013-07-06 DIAGNOSIS — Z791 Long term (current) use of non-steroidal anti-inflammatories (NSAID): Secondary | ICD-10-CM | POA: Insufficient documentation

## 2013-07-06 DIAGNOSIS — Z880 Allergy status to penicillin: Secondary | ICD-10-CM | POA: Insufficient documentation

## 2013-07-06 DIAGNOSIS — J45909 Unspecified asthma, uncomplicated: Secondary | ICD-10-CM | POA: Insufficient documentation

## 2013-07-06 DIAGNOSIS — Z79899 Other long term (current) drug therapy: Secondary | ICD-10-CM | POA: Insufficient documentation

## 2013-07-06 DIAGNOSIS — I10 Essential (primary) hypertension: Secondary | ICD-10-CM | POA: Insufficient documentation

## 2013-07-06 LAB — BASIC METABOLIC PANEL
BUN: 12 mg/dL (ref 6–23)
CO2: 27 mEq/L (ref 19–32)
Calcium: 9.5 mg/dL (ref 8.4–10.5)
Chloride: 101 mEq/L (ref 96–112)
Creatinine, Ser: 0.9 mg/dL (ref 0.50–1.10)
GFR, EST NON AFRICAN AMERICAN: 78 mL/min — AB (ref >90)
Glucose, Bld: 99 mg/dL (ref 70–99)
POTASSIUM: 4.3 meq/L (ref 3.7–5.3)
SODIUM: 138 meq/L (ref 137–147)

## 2013-07-06 MED ORDER — HYDROCHLOROTHIAZIDE 25 MG PO TABS: 25.0000 mg | ORAL_TABLET | Freq: Every day | ORAL | Status: DC

## 2013-07-06 MED ORDER — ONDANSETRON 4 MG PO TBDP
4.0000 mg | ORAL_TABLET | Freq: Once | ORAL | Status: AC
  Administered 2013-07-06: 4 mg via ORAL
  Filled 2013-07-06: qty 1

## 2013-07-06 NOTE — Discharge Instructions (Signed)
Arterial Hypertension  Arterial hypertension (high blood pressure) is a condition of elevated pressure in your blood vessels. Hypertension over a long period of time is a risk factor for strokes, heart attacks, and heart failure. It is also the leading cause of kidney (renal) failure.   CAUSES   · In Adults -- Over 90% of all hypertension has no known cause. This is called essential or primary hypertension. In the other 10% of people with hypertension, the increase in blood pressure is caused by another disorder. This is called secondary hypertension. Important causes of secondary hypertension are:  · Heavy alcohol use.  · Obstructive sleep apnea.  · Hyperaldosterosim (Conn's syndrome).  · Steroid use.  · Chronic kidney failure.  · Hyperparathyroidism.  · Medications.  · Renal artery stenosis.  · Pheochromocytoma.  · Cushing's disease.  · Coarctation of the aorta.  · Scleroderma renal crisis.  · Licorice (in excessive amounts).  · Drugs (cocaine, methamphetamine).  Your caregiver can explain any items above that apply to you.  · In Children -- Secondary hypertension is more common and should always be considered.  · Pregnancy -- Few women of childbearing age have high blood pressure. However, up to 10% of them develop hypertension of pregnancy. Generally, this will not harm the woman. It may be a sign of 3 complications of pregnancy: preeclampsia, HELLP syndrome, and eclampsia. Follow up and control with medication is necessary.  SYMPTOMS   · This condition normally does not produce any noticeable symptoms. It is usually found during a routine exam.  · Malignant hypertension is a late problem of high blood pressure. It may have the following symptoms:  · Headaches.  · Blurred vision.  · End-organ damage (this means your kidneys, heart, lungs, and other organs are being damaged).  · Stressful situations can increase the blood pressure. If a person with normal blood pressure has their blood pressure go up while being  seen by their caregiver, this is often termed "white coat hypertension." Its importance is not known. It may be related with eventually developing hypertension or complications of hypertension.  · Hypertension is often confused with mental tension, stress, and anxiety.  DIAGNOSIS   The diagnosis is made by 3 separate blood pressure measurements. They are taken at least 1 week apart from each other. If there is organ damage from hypertension, the diagnosis may be made without repeat measurements.  Hypertension is usually identified by having blood pressure readings:  · Above 140/90 mmHg measured in both arms, at 3 separate times, over a couple weeks.  · Over 130/80 mmHg should be considered a risk factor and may require treatment in patients with diabetes.  Blood pressure readings over 120/80 mmHg are called "pre-hypertension" even in non-diabetic patients.  To get a true blood pressure measurement, use the following guidelines. Be aware of the factors that can alter blood pressure readings.  · Take measurements at least 1 hour after caffeine.  · Take measurements 30 minutes after smoking and without any stress. This is another reason to quit smoking  it raises your blood pressure.  · Use a proper cuff size. Ask your caregiver if you are not sure about your cuff size.  · Most home blood pressure cuffs are automatic. They will measure systolic and diastolic pressures. The systolic pressure is the pressure reading at the start of sounds. Diastolic pressure is the pressure at which the sounds disappear. If you are elderly, measure pressures in multiple postures. Try sitting, lying or   standing.  · Sit at rest for a minimum of 5 minutes before taking measurements.  · You should not be on any medications like decongestants. These are found in many cold medications.  · Record your blood pressure readings and review them with your caregiver.  If you have hypertension:  · Your caregiver may do tests to be sure you do not have  secondary hypertension (see "causes" above).  · Your caregiver may also look for signs of metabolic syndrome. This is also called Syndrome X or Insulin Resistance Syndrome. You may have this syndrome if you have type 2 diabetes, abdominal obesity, and abnormal blood lipids in addition to hypertension.  · Your caregiver will take your medical and family history and perform a physical exam.  · Diagnostic tests may include blood tests (for glucose, cholesterol, potassium, and kidney function), a urinalysis, or an EKG. Other tests may also be necessary depending on your condition.  PREVENTION   There are important lifestyle issues that you can adopt to reduce your chance of developing hypertension:  · Maintain a normal weight.  · Limit the amount of salt (sodium) in your diet.  · Exercise often.  · Limit alcohol intake.  · Get enough potassium in your diet. Discuss specific advice with your caregiver.  · Follow a DASH diet (dietary approaches to stop hypertension). This diet is rich in fruits, vegetables, and low-fat dairy products, and avoids certain fats.  PROGNOSIS   Essential hypertension cannot be cured. Lifestyle changes and medical treatment can lower blood pressure and reduce complications. The prognosis of secondary hypertension depends on the underlying cause. Many people whose hypertension is controlled with medicine or lifestyle changes can live a normal, healthy life.   RISKS AND COMPLICATIONS   While high blood pressure alone is not an illness, it often requires treatment due to its short- and long-term effects on many organs. Hypertension increases your risk for:  · CVAs or strokes (cerebrovascular accident).  · Heart failure due to chronically high blood pressure (hypertensive cardiomyopathy).  · Heart attack (myocardial infarction).  · Damage to the retina (hypertensive retinopathy).  · Kidney failure (hypertensive nephropathy).  Your caregiver can explain list items above that apply to you. Treatment  of hypertension can significantly reduce the risk of complications.  TREATMENT   · For overweight patients, weight loss and regular exercise are recommended. Physical fitness lowers blood pressure.  · Mild hypertension is usually treated with diet and exercise. A diet rich in fruits and vegetables, fat-free dairy products, and foods low in fat and salt (sodium) can help lower blood pressure. Decreasing salt intake decreases blood pressure in a 1/3 of people.  · Stop smoking if you are a smoker.  The steps above are highly effective in reducing blood pressure. While these actions are easy to suggest, they are difficult to achieve. Most patients with moderate or severe hypertension end up requiring medications to bring their blood pressure down to a normal level. There are several classes of medications for treatment. Blood pressure pills (antihypertensives) will lower blood pressure by their different actions. Lowering the blood pressure by 10 mmHg may decrease the risk of complications by as much as 25%.  The goal of treatment is effective blood pressure control. This will reduce your risk for complications. Your caregiver will help you determine the best treatment for you according to your lifestyle. What is excellent treatment for one person, may not be for you.  HOME CARE INSTRUCTIONS   · Do not   smoke.  · Follow the lifestyle changes outlined in the "Prevention" section.  · If you are on medications, follow the directions carefully. Blood pressure medications must be taken as prescribed. Skipping doses reduces their benefit. It also puts you at risk for problems.  · Follow up with your caregiver, as directed.  · If you are asked to monitor your blood pressure at home, follow the guidelines in the "Diagnosis" section above.  SEEK MEDICAL CARE IF:   · You think you are having medication side effects.  · You have recurrent headaches or lightheadedness.  · You have swelling in your ankles.  · You have trouble with  your vision.  SEEK IMMEDIATE MEDICAL CARE IF:   · You have sudden onset of chest pain or pressure, difficulty breathing, or other symptoms of a heart attack.  · You have a severe headache.  · You have symptoms of a stroke (such as sudden weakness, difficulty speaking, difficulty walking).  MAKE SURE YOU:   · Understand these instructions.  · Will watch your condition.  · Will get help right away if you are not doing well or get worse.  Document Released: 05/06/2005 Document Revised: 07/29/2011 Document Reviewed: 12/04/2006  ExitCare® Patient Information ©2014 [employer].  DASH Diet  The DASH diet stands for "Dietary Approaches to Stop Hypertension." It is a healthy eating plan that has been shown to reduce high blood pressure (hypertension) in as little as 14 days, while also possibly providing other significant health benefits. These other health benefits include reducing the risk of breast cancer after menopause and reducing the risk of type 2 diabetes, heart disease, colon cancer, and stroke. Health benefits also include weight loss and slowing kidney failure in patients with chronic kidney disease.   DIET GUIDELINES  · Limit salt (sodium). Your diet should contain less than 1500 mg of sodium daily.  · Limit refined or processed carbohydrates. Your diet should include mostly whole grains. Desserts and added sugars should be used sparingly.  · Include small amounts of heart-healthy fats. These types of fats include nuts, oils, and tub margarine. Limit saturated and trans fats. These fats have been shown to be harmful in the body.  CHOOSING FOODS   The following food groups are based on a 2000 calorie diet. See your Registered Dietitian for individual calorie needs.  Grains and Grain Products (6 to 8 servings daily)  · Eat More Often: Whole-wheat bread, brown rice, whole-grain or wheat pasta, quinoa, popcorn without added fat or salt (air popped).  · Eat Less Often: White bread, white pasta, white rice,  cornbread.  Vegetables (4 to 5 servings daily)  · Eat More Often: Fresh, frozen, and canned vegetables. Vegetables may be raw, steamed, roasted, or grilled with a minimal amount of fat.  · Eat Less Often/Avoid: Creamed or fried vegetables. Vegetables in a cheese sauce.  Fruit (4 to 5 servings daily)  · Eat More Often: All fresh, canned (in natural juice), or frozen fruits. Dried fruits without added sugar. One hundred percent fruit juice (½ cup [237 mL] daily).  · Eat Less Often: Dried fruits with added sugar. Canned fruit in light or heavy syrup.  Lean Meats, Fish, and Poultry (2 servings or less daily. One serving is 3 to 4 oz [85-114 g]).  · Eat More Often: Ninety percent or leaner ground beef, tenderloin, sirloin. Round cuts of beef, chicken breast, turkey breast. All fish. Grill, bake, or broil your meat. Nothing should be fried.  · Eat   Less Often/Avoid: Fatty cuts of meat, turkey, or chicken leg, thigh, or wing. Fried cuts of meat or fish.  Dairy (2 to 3 servings)  · Eat More Often: Low-fat or fat-free milk, low-fat plain or light yogurt, reduced-fat or part-skim cheese.  · Eat Less Often/Avoid: Milk (whole, 2%). Whole milk yogurt. Full-fat cheeses.  Nuts, Seeds, and Legumes (4 to 5 servings per week)  · Eat More Often: All without added salt.  · Eat Less Often/Avoid: Salted nuts and seeds, canned beans with added salt.  Fats and Sweets (limited)  · Eat More Often: Vegetable oils, tub margarines without trans fats, sugar-free gelatin. Mayonnaise and salad dressings.  · Eat Less Often/Avoid: Coconut oils, palm oils, butter, stick margarine, cream, half and half, cookies, candy, pie.  FOR MORE INFORMATION  The Dash Diet Eating Plan: www.dashdiet.org  Document Released: 04/25/2011 Document Revised: 07/29/2011 Document Reviewed: 04/25/2011  Exit[employer]® Patient Information ©2014 [employer].

## 2013-07-06 NOTE — ED Notes (Signed)
Nausea and headache. Her BP was elevated at work today.

## 2013-07-06 NOTE — ED Provider Notes (Signed)
CSN: [id number]     Arrival date & time 07/06/13  1240 History   First MD Initiated Contact with Patient 07/06/13 1454     Chief Complaint  Patient presents with  . Headache     (Consider location/radiation/quality/duration/timing/severity/associated sxs/prior Treatment) Patient is a 43 y.o. female presenting with hypertension. The history is provided by the patient. No language interpreter was used.  Hypertension This is a new problem. The current episode started in the past 7 days. The problem occurs constantly. The problem has been gradually worsening. Associated symptoms include nausea. Pertinent negatives include no fever. Nothing aggravates the symptoms. She has tried nothing for the symptoms. The treatment provided moderate relief.  Pt complains of a headache and nausea.  Pt had her blood pressure checked at work and it was elevated  Past Medical History  Diagnosis Date  . Asthma   . GERD (gastroesophageal reflux disease)    History reviewed. No pertinent past surgical history. No family history on file. History  Substance Use Topics  . Smoking status: Never Smoker   . Smokeless tobacco: Not on file  . Alcohol Use: Yes   OB History   Grav Para Term Preterm Abortions TAB SAB Ect Mult Living                 Review of Systems  Constitutional: Negative for fever.  Gastrointestinal: Positive for nausea.  All other systems reviewed and are negative.      Allergies  Aspirin; Penicillins; and Sulfa drugs cross reactors  Home Medications   Current Outpatient Rx  Name  Route  Sig  Dispense  Refill  . albuterol (PROVENTIL HFA;VENTOLIN HFA) 108 (90 BASE) MCG/ACT inhaler   Inhalation   Inhale 2 puffs into the lungs every 6 (six) hours as needed.         . cetirizine (ZYRTEC) 10 MG tablet   Oral   Take 10 mg by mouth 2 (two) times daily.         . cyclobenzaprine (FLEXERIL) 10 MG tablet   Oral   Take 1 tablet (10 mg total) by mouth 2 (two) times daily as needed  for muscle spasms.   20 tablet   0   . diphenhydrAMINE (BENADRYL) 25 MG tablet   Oral   Take 25 mg by mouth every 6 (six) hours as needed.         Marland Kitchen. FLUoxetine (PROZAC) 40 MG capsule   Oral   Take 40 mg by mouth daily.         Marland Kitchen. ibuprofen (ADVIL,MOTRIN) 800 MG tablet   Oral   Take 1 tablet (800 mg total) by mouth 3 (three) times daily.   21 tablet   0   . levonorgestrel (MIRENA) 20 MCG/24HR IUD   Intrauterine   1 each by Intrauterine route once.         . montelukast (SINGULAIR) 10 MG tablet   Oral   Take 10 mg by mouth at bedtime.         Marland Kitchen. omeprazole (PRILOSEC) 40 MG capsule   Oral   Take 40 mg by mouth daily.          BP 162/101  Pulse 82  Temp(Src) 98 F (36.7 C) (Oral)  Resp 20  Ht 5\' 8"  (1.727 m)  Wt 205 lb (92.987 kg)  BMI 31.18 kg/m2  SpO2 100% Physical Exam  Nursing note and vitals reviewed. Constitutional: She appears well-developed and well-nourished.  HENT:  Head: Normocephalic and atraumatic.  Right Ear: External ear normal.  Left Ear: External ear normal.  Nose: Nose normal.  Mouth/Throat: Oropharynx is clear and moist.  Eyes: Pupils are equal, round, and reactive to light.  Neck: Normal range of motion.  Cardiovascular: Normal rate and normal heart sounds.   Pulmonary/Chest: Effort normal and breath sounds normal.  Abdominal: Soft.  Skin: Skin is warm.    ED Course  Procedures (including critical care time) Labs Review Labs Reviewed - No data to display Imaging Review No results found.  EKG Interpretation   None       MDM   Final diagnoses:  Hypertension    Pt given zofran for nausea.    I will start pt on hctz.       Lonia Skinner [place], PA-C 07/06/13 1625

## 2013-07-07 NOTE — ED Provider Notes (Signed)
History/physical exam/procedure(s) were performed by non-physician practitioner and as supervising physician I was immediately available for consultation/collaboration. I have reviewed all notes and am in agreement with care and plan.   Hilario Quarryanielle S Dorance Spink, MD 07/07/13 [id number]

## 2014-01-20 ENCOUNTER — Other Ambulatory Visit: Payer: Self-pay | Admitting: Obstetrics and Gynecology

## 2014-01-20 DIAGNOSIS — Z1231 Encounter for screening mammogram for malignant neoplasm of breast: Secondary | ICD-10-CM

## 2017-06-18 ENCOUNTER — Emergency Department (HOSPITAL_BASED_OUTPATIENT_CLINIC_OR_DEPARTMENT_OTHER)
Admission: EM | Admit: 2017-06-18 | Discharge: 2017-06-18 | Disposition: A | Discharge status: Home / Self Care | Payer: Managed Care, Other (non HMO) | Attending: Emergency Medicine | Admitting: Emergency Medicine

## 2017-06-18 ENCOUNTER — Other Ambulatory Visit: Payer: Self-pay

## 2017-06-18 ENCOUNTER — Encounter (HOSPITAL_BASED_OUTPATIENT_CLINIC_OR_DEPARTMENT_OTHER): Payer: Self-pay

## 2017-06-18 DIAGNOSIS — R69 Illness, unspecified: Secondary | ICD-10-CM

## 2017-06-18 DIAGNOSIS — J111 Influenza due to unidentified influenza virus with other respiratory manifestations: Secondary | ICD-10-CM | POA: Insufficient documentation

## 2017-06-18 DIAGNOSIS — Z7982 Long term (current) use of aspirin: Secondary | ICD-10-CM | POA: Diagnosis not present

## 2017-06-18 DIAGNOSIS — R05 Cough: Secondary | ICD-10-CM | POA: Diagnosis present

## 2017-06-18 DIAGNOSIS — J45909 Unspecified asthma, uncomplicated: Secondary | ICD-10-CM | POA: Diagnosis not present

## 2017-06-18 DIAGNOSIS — Z79899 Other long term (current) drug therapy: Secondary | ICD-10-CM | POA: Diagnosis not present

## 2017-06-18 DIAGNOSIS — I10 Essential (primary) hypertension: Secondary | ICD-10-CM | POA: Insufficient documentation

## 2017-06-18 HISTORY — DX: Depression, unspecified: F32.A

## 2017-06-18 HISTORY — DX: Herpesviral infection, unspecified: B00.9

## 2017-06-18 HISTORY — DX: Major depressive disorder, single episode, unspecified: F32.9

## 2017-06-18 HISTORY — DX: Anxiety disorder, unspecified: F41.9

## 2017-06-18 HISTORY — DX: Essential (primary) hypertension: I10

## 2017-06-18 MED ORDER — GUAIFENESIN-CODEINE 100-10 MG/5ML PO SOLN: 10.0000 mL | Freq: Four times a day (QID) | ORAL | 0 refills | Status: AC | PRN

## 2017-06-18 NOTE — ED Notes (Signed)
ED Provider at bedside. 

## 2017-06-18 NOTE — ED Provider Notes (Signed)
[facility] EMERGENCY DEPARTMENT Provider Note   CSN: [id number] Arrival date & time: 06/18/17  2133     History   Chief Complaint Chief Complaint  Patient presents with  . Cough    HPI Maria Reid is a 47 y.o. female.  Patient is a 47 year old female with past medical history of hypertension presenting for evaluation of sore throat, cough, congestion, ear pressure for the past 2 days.  She has been taking over-the-counter medications and was prescribed Augmentin by her online physician.  This is not helping.  She reports her son in the house who was recently diagnosed with strep throat and she thought this was the case, however the antibiotic is not helping.   The history is provided by the patient.  Cough  This is a new problem. The current episode started 2 days ago. The problem occurs constantly. The problem has been gradually worsening. The cough is productive of sputum. There has been no fever. Associated symptoms include chills, ear congestion, rhinorrhea, sore throat and shortness of breath. She has tried decongestants (Augmentin) for the symptoms. The treatment provided no relief.    Past Medical History:  Diagnosis Date  . Anxiety   . Asthma   . Depression   . GERD (gastroesophageal reflux disease)   . Herpes   . Hypertension     Patient Active Problem List   Diagnosis Date Noted  . 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 Endometrial polyp 06/29/2011  . Asthma 06/29/2011    Past Surgical History:  Procedure Laterality Date  . LASIK    . NASAL SEPTUM SURGERY      OB History    No data available       Home Medications    Prior to Admission medications   Medication Sig Start Date End Date Taking? Authorizing Provider  AmLODIPine Besylate (NORVASC PO) Take by mouth.   Yes [provider]  aspirin 81 MG chewable tablet Chew by mouth daily.   Yes [provider]  fluticasone (FLONASE) 50 MCG/ACT nasal spray Place into both  nostrils daily.   Yes [provider]  albuterol (PROVENTIL HFA;VENTOLIN HFA) 108 (90 BASE) MCG/ACT inhaler Inhale 2 puffs into the lungs every 6 (six) hours as needed.    [provider]  cetirizine (ZYRTEC) 10 MG tablet Take 10 mg by mouth 2 (two) times daily.    [provider]  diphenhydrAMINE (BENADRYL) 25 MG tablet Take 25 mg by mouth every 6 (six) hours as needed.    [provider]  FLUoxetine (PROZAC) 40 MG capsule Take 40 mg by mouth daily.    [provider]  ibuprofen (ADVIL,MOTRIN) 800 MG tablet Take 1 tablet (800 mg total) by mouth 3 (three) times daily. 05/29/13   Fayrene Helper, PA-C  levonorgestrel (MIRENA) 20 MCG/24HR IUD 1 each by Intrauterine route once.    [provider]  montelukast (SINGULAIR) 10 MG tablet Take 10 mg by mouth at bedtime.    [provider]  omeprazole (PRILOSEC) 40 MG capsule Take 40 mg by mouth daily.    [provider]    Family History No family history on file.  Social History Social History   Tobacco Use  . Smoking status: Never Smoker  . Smokeless tobacco: Never Used  Substance Use Topics  . Alcohol use: Yes    Comment: occ  . Drug use: No     Allergies   Losartan   Review of Systems Review of Systems  Constitutional: Positive for chills.  HENT: Positive for rhinorrhea and sore throat.   Respiratory: Positive for cough and shortness of breath.   All other systems reviewed and are negative.    Physical Exam Updated Vital Signs BP (!) 146/84 (BP Location: Left Arm)   Pulse 99   Temp 98.8 F (37.1 C) (Oral)   Resp (!) 24   Ht 5\' 8"  (1.727 m)   Wt 100.4 kg (221 lb 4.8 oz)   SpO2 98%   BMI 33.65 kg/m   Physical Exam  Constitutional: She is oriented to person, place, and time. She appears well-developed and well-nourished. No distress.  HENT:  Head: Normocephalic and atraumatic.  Mouth/Throat: Oropharynx is clear and moist.  TMs are clear bilaterally.    Neck: Normal range of motion. Neck supple.  Cardiovascular: Normal rate and regular rhythm. Exam reveals no gallop and no friction rub.  No murmur heard. Pulmonary/Chest: Effort normal and breath sounds normal. No respiratory distress. She has no wheezes. She has no rales.  Abdominal: Soft. Bowel sounds are normal. She exhibits no distension. There is no tenderness.  Musculoskeletal: Normal range of motion.  Lymphadenopathy:    She has no cervical adenopathy.  Neurological: She is alert and oriented to person, place, and time.  Skin: Skin is warm and dry. She is not diaphoretic.  Nursing note and vitals reviewed.    ED Treatments / Results  Labs (all labs ordered are listed, but only abnormal results are displayed) Labs Reviewed - No data to display  EKG  EKG Interpretation None       Radiology No results found.  Procedures Procedures (including critical care time)  Medications Ordered in ED Medications - No data to display   Initial Impression / Assessment and Plan / ED Course  I have reviewed the triage vital signs and the nursing notes.  Pertinent labs & imaging results that were available during my care of the patient were reviewed by me and considered in my medical decision making (see chart for details).  Patient presents with complaints of URI-like symptoms that sound viral in nature.  She is not improving despite Augmentin.  Her physical examination is essentially unremarkable.  Her lungs are clear, oxygen saturations are 98%, and she is in no respiratory distress.  I highly suspect a viral etiology.  I will recommend continued Tylenol, Motrin, fluids, and I will prescribe cough medication which she can take to help her rest at night.  She is to return as needed for any problems.  Final Clinical Impressions(s) / ED Diagnoses   Final diagnoses:  None    ED Discharge Orders    None       Geoffery Lyonselo, Samyukta Cura, MD 06/18/17 2329

## 2017-06-18 NOTE — Discharge Instructions (Signed)
Tylenol 1000 mg rotated with ibuprofen 600 mg every 4 hours as needed for pain or fever.  Robitussin with codeine as prescribed as needed for cough.  Continue over-the-counter medications as needed for symptomatic relief.  Return to the emergency department if you develop severe chest pain, worsening breathing, or other new and concerning symptoms.

## 2017-06-18 NOTE — ED Triage Notes (Signed)
C/o flu like sx day 2-states son has strep-PCP started pt on amoxil 2 days ago-NAD-steady gait

## 2018-07-23 ENCOUNTER — Ambulatory Visit (INDEPENDENT_AMBULATORY_CARE_PROVIDER_SITE_OTHER): Payer: 59 | Admitting: Otolaryngology

## 2018-07-23 DIAGNOSIS — K219 Gastro-esophageal reflux disease without esophagitis: Secondary | ICD-10-CM

## 2018-07-23 DIAGNOSIS — J310 Chronic rhinitis: Secondary | ICD-10-CM

## 2021-09-12 ENCOUNTER — Other Ambulatory Visit: Payer: Self-pay | Admitting: Obstetrics and Gynecology

## 2021-09-19 ENCOUNTER — Other Ambulatory Visit: Payer: Self-pay | Admitting: Obstetrics and Gynecology

## 2021-09-19 DIAGNOSIS — N6310 Unspecified lump in the right breast, unspecified quadrant: Secondary | ICD-10-CM

## 2021-09-20 ENCOUNTER — Ambulatory Visit: Admission: RE | Admit: 2021-09-20 | Payer: Self-pay | Source: Ambulatory Visit

## 2021-09-20 ENCOUNTER — Ambulatory Visit
Admission: RE | Admit: 2021-09-20 | Discharge: 2021-09-20 | Disposition: A | Discharge status: Home / Self Care | Payer: Medicaid Other | Source: Ambulatory Visit | Attending: Obstetrics and Gynecology | Admitting: Obstetrics and Gynecology

## 2021-09-20 ENCOUNTER — Other Ambulatory Visit: Payer: Self-pay | Admitting: Obstetrics and Gynecology

## 2021-09-20 DIAGNOSIS — R928 Other abnormal and inconclusive findings on diagnostic imaging of breast: Secondary | ICD-10-CM

## 2021-09-20 DIAGNOSIS — N6310 Unspecified lump in the right breast, unspecified quadrant: Secondary | ICD-10-CM

## 2021-09-25 ENCOUNTER — Ambulatory Visit: Payer: Medicaid Other | Admitting: Physical Therapy

## 2021-09-26 ENCOUNTER — Ambulatory Visit: Payer: Medicaid Other | Attending: Urology | Admitting: Physical Therapy

## 2021-09-26 DIAGNOSIS — R269 Unspecified abnormalities of gait and mobility: Secondary | ICD-10-CM | POA: Insufficient documentation

## 2021-09-26 DIAGNOSIS — R293 Abnormal posture: Secondary | ICD-10-CM | POA: Insufficient documentation

## 2021-09-26 DIAGNOSIS — R279 Unspecified lack of coordination: Secondary | ICD-10-CM | POA: Insufficient documentation

## 2021-09-26 DIAGNOSIS — M6281 Muscle weakness (generalized): Secondary | ICD-10-CM | POA: Diagnosis present

## 2021-09-26 NOTE — Patient Instructions (Signed)

## 2021-09-26 NOTE — Therapy (Signed)
?OUTPATIENT PHYSICAL THERAPY FEMALE PELVIC EVALUATION ? ? ?Patient Name: Maria Reid ?MRN: [id number] ?DOB:08/13/1970, 51 y.o., female ?Today's Date: 09/26/2021 ? ? ? ?Past Medical History:  ?Diagnosis Date  ? Anxiety   ? Asthma   ? Depression   ? GERD (gastroesophageal reflux disease)   ? Herpes   ? Hypertension   ? ?Past Surgical History:  ?Procedure Laterality Date  ? LASIK    ? NASAL SEPTUM SURGERY    ? ?Patient Active Problem List  ? Diagnosis Date Noted  ? 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 Endometrial polyp 06/29/2011  ? Asthma 06/29/2011  ? ? ?PCP: Andi DevonShelton, Kimberly MD ? ?REFERRING PROVIDER: Alfredo MartinezMacDiarmid, Scott, MD ? ?REFERRING DIAG: N39.46 (ICD-10-CM) - Mixed incontinence ? ? ?THERAPY DIAG:  ?No diagnosis found. ? ?ONSET DATE: life-long  ? ?SUBJECTIVE:                                                                                                                                                                                          ? ?SUBJECTIVE STATEMENT: ?Pt reports a life long h/o urinary incontinence with intense urgency. Will have leakage with inability to make it to the bathroom quickly enough, pt does get up ~5x a night to urinate but no leakage usually with this.  ?Fluid intake: Yes: 75-85 oz, sometimes a soda or alcohol    ? ?Patient confirms identification and approves PT to assess pelvic floor and treatment Yes ? ? ?PAIN:  ?Are you having pain? NO ? ? ?PRECAUTIONS: None ? ?WEIGHT BEARING RESTRICTIONS No ? ?FALLS:  ?Has patient fallen in last 6 months? No ? ?LIVING ENVIRONMENT: ?Lives with: lives with their family ?Lives in: House/apartment ? ? ?OCCUPATION: RN for dialysis  ? ?PLOF: Independent ? ?PATIENT GOALS to have less leakage and urgency  ? ?PERTINENT HISTORY:  ?One vaginal birth ? ? ?BOWEL MOVEMENT ?Pain with bowel movement: No ?Type of bowel movement:Type (Bristol Stool Scale) 4, Frequency daily, and Strain No ?Fully empty rectum: Yes:   ?Leakage: No ?Pads: No ?Fiber supplement:  No ? ?URINATION ?Pain with urination: No ?Fully empty bladder: No sometimes has to urinate back to back due to feeling more urine ?Stream: Strong ?Urgency: Yes: consistency ?Frequency: 3 hours during the day and get up 5x times per night ?Leakage: Urge to void, Walking to the bathroom, Coughing, Sneezing, Laughing, and Intercourse ?Pads: No ? ?INTERCOURSE ?Pain with intercourse:  no pain, denied dryness ?Ability to have vaginal penetration:  Yes:   ?Climax: no pain ?Marinoff Scale: 0/3 ? ?PREGNANCY ?Vaginal deliveries 1 ?Tearing Yes: episiotomy  ?C-section deliveries 0 ?Currently pregnant No ? ?PROLAPSE ?None ? ? ? ?  OBJECTIVE:  ? ?DIAGNOSTIC FINDINGS:  ? ? ?COGNITION: ? Overall cognitive status: Within functional limits for tasks assessed   ?  ?SENSATION: ? Light touch: Appears intact ? Proprioception: Appears intact ? ?MUSCLE LENGTH: ?Bil hamstrings and adductors limited by 50% ? ? ?FUNCTIONAL TESTS:  ?Functional squat: pt limited with Lt knee recent meniscal tear per pt and unable to complete  ? ?GAIT: ?Distance walked: 300' ?Assistive device utilized: Single point cane ?Level of assistance: Modified independence ?Comments: limited with recent lt knee meniscal tear and impaired gait mechanics with this and antalgic gait.  ? ?POSTURE:  ?Mild rounded shoulders, posterior pelvic tilt  ? ?LUMBARAROM/PROM ? ?Decreased by 25% in all directions ? ?LE ROM: ? ?WFL, Lt knee limited due to pain ? ?LE MMT: ? ?Bil hips grossly 4/5; Lt knee limited due to pain; Rt knee 5/5; ankles 5/5 ? ?PELVIC MMT: ?  ?MMT  ?09/26/2021  ?Vaginal 4/5; 4s; 3 reps  ?Internal Anal Sphincter   ?External Anal Sphincter   ?Puborectalis   ?Diastasis Recti   ?(Blank rows = not tested) ? ?      PALPATION: ?  General  no TTP but mild fascial restriction ? ?              External Perineal Exam no TTP ?              ?              Internal Pelvic Floor no TTP ? ?TONE: ?WFL ? ?PROLAPSE: ?Not seen in hooklying  ? ?TODAY'S TREATMENT  ?09/26/2021 EVAL  Examination completed, findings reviewed, pt educated on POC, HEP, bladder irritants and urge drill. Pt motivated to participate in PT and agreeable to attempt recommendations.   ? ?  ? ?If treatment provided at initial evaluation, no treatment charged due to lack of authorization.    ?  ? ?PATIENT EDUCATION:  ?Education details: [id number] ?Person educated: Patient ?Education method: Explanation, Demonstration, Tactile cues, Verbal cues, and Handouts ?Education comprehension: verbalized understanding and returned demonstration ? ? ?HOME EXERCISE PROGRAM: ?[id number] ? ?ASSESSMENT: ? ?CLINICAL IMPRESSION: ?Patient is a 51 y.o. female  who was seen today for physical therapy evaluation and treatment for incontinence, urgency and increased urinary frequency. Pt found to have decreased bil hip strength and flexibility, decreased flexibility at spine, impaired gait mechanics due to Lt knee meniscal tear and now using Single point cane for the last 2 weeks. Pt reports her urinary issues have been life long and very motivated to improve. Pt consented to internal vaginal assessment this date and found to have decreased strength, coordination, and endurance.  Pt would benefit from additional PT to further address deficits.   ? ? ?OBJECTIVE IMPAIRMENTS decreased coordination, decreased endurance, decreased mobility, difficulty walking, decreased strength, increased fascial restrictions, improper body mechanics, and postural dysfunction.  ? ?ACTIVITY LIMITATIONS community activity, driving, and yard work.  ? ?PERSONAL FACTORS Time since onset of injury/illness/exacerbation and 1 comorbidity: one vaginal birth with episiotomy   are also affecting patient's functional outcome.  ? ? ?REHAB POTENTIAL: Good ? ?CLINICAL DECISION MAKING: Stable/uncomplicated ? ?EVALUATION COMPLEXITY: Low ? ? ?GOALS: ?Goals reviewed with patient? Yes ? ?SHORT TERM GOALS: Target date: 10/24/2021  (Remove Blue Hyperlink) ? ?Pt to be I with HEP.   ?Baseline: ?Goal status: INITIAL ? ?2.  Pt will have 25% less urgency due to bladder retraining and strengthening  ?Baseline:  ?Goal status: INITIAL ? ?3.  Pt to report improved void frequency to no  more than 3 nighttime voids for improved QOL with decreased urinary frequency.   ?Baseline:  ?Goal status: INITIAL ? ? ? ?LONG TERM GOALS: Target date: 12/27/2021  (Remove Blue Hyperlink) ? ?Pt to be I with advanced HEP.  ?Baseline: given at eval ?Goal status: INITIAL ? ?2.  Pt will have 50% less urgency due to bladder retraining and strengthening  ?Baseline: 100% of the time  ?Goal status: INITIAL ? ?3.  Pt to report improved void frequency to no more than 2 nighttime voids for improved QOL with decreased urinary frequency.   ?Baseline: 5 times ?Goal status: INITIAL ? ?4.  Pt to report improved time between bladder voids to at least 3-4 hours with minimal urgency and not leakage for improved QOL with decreased urinary frequency.   ?Baseline: sometimes 45 mins sometimes 3 hours per pt ?Goal status: INITIAL ? ?5.  Pt to demonstrate at least 5/5 bil hip strength for improved pelvic stability and functional squats without leakage.  ?Baseline:  ?Goal status: INITIAL ? ?6.  Pt to demonstrated improved coordination of pelvic floor and breathing mechanics for 15# squat without leakage for improved pelvic stability and decreased leakage.  ?Baseline: unable ?Goal status: INITIAL ? ?PLAN: ?PT FREQUENCY: 1x/week ? ?PT DURATION:  8 sessions ? ?PLANNED INTERVENTIONS: Therapeutic exercises, Therapeutic activity, Neuromuscular re-education, Patient/Family education, Joint mobilization, Dry Needling, Spinal mobilization, Cryotherapy, Moist heat, Manual lymph drainage, scar mobilization, Taping, Biofeedback, and Manual therapy ? ?PLAN FOR NEXT SESSION: coordination of pelvic floor with strengthening, voiding mechanics, breathing mechanics ? ? ?Otelia Sergeant, PT, DPT ?05/10/234:24 PM  ?

## 2021-10-04 ENCOUNTER — Ambulatory Visit: Payer: Medicaid Other | Admitting: Physical Therapy

## 2021-10-05 ENCOUNTER — Ambulatory Visit: Payer: Medicaid Other | Admitting: Physical Therapy

## 2021-10-05 DIAGNOSIS — M6281 Muscle weakness (generalized): Secondary | ICD-10-CM

## 2021-10-05 DIAGNOSIS — R293 Abnormal posture: Secondary | ICD-10-CM

## 2021-10-05 DIAGNOSIS — R269 Unspecified abnormalities of gait and mobility: Secondary | ICD-10-CM

## 2021-10-05 DIAGNOSIS — R279 Unspecified lack of coordination: Secondary | ICD-10-CM

## 2021-10-05 NOTE — Therapy (Signed)
OUTPATIENT PHYSICAL THERAPY TREATMENT NOTE   Patient Name: Maria Reid MRN: [id number] DOB:1970-10-18, 51 y.o., female Today's Date: 10/05/2021  PCP: Andi Devon MD REFERRING PROVIDER: Alfredo Martinez, MD  END OF SESSION:   PT End of Session - 10/05/21 1019     Visit Number 2    Date for PT Re-Evaluation 12/27/21    Authorization Type medicaid healthy blue    PT Start Time 1018    PT Stop Time 1056    PT Time Calculation (min) 38 min    Activity Tolerance Patient tolerated treatment well    Behavior During Therapy WFL for tasks assessed/performed             Past Medical History:  Diagnosis Date   Anxiety    Asthma    Depression    GERD (gastroesophageal reflux disease)    Herpes    Hypertension    Past Surgical History:  Procedure Laterality Date   LASIK     NASAL SEPTUM SURGERY     Patient Active Problem List   Diagnosis Date Noted   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 Urge drill has been helpful. Pt reports she had one night last week without getting up once to urinate.   PAIN:  Are you having pain? Yes: NPRS scale: 5/10 Pain location: Lt knee    OBJECTIVE: (objective measures completed at initial evaluation unless otherwise dated)   DIAGNOSTIC FINDINGS:      COGNITION:            Overall cognitive status: Within functional limits for tasks assessed                          SENSATION:            Light touch: Appears intact            Proprioception: Appears intact   MUSCLE  LENGTH: Bil hamstrings and adductors limited by 50%     FUNCTIONAL TESTS:  Functional squat: pt limited with Lt knee recent meniscal tear per pt and unable to complete    GAIT: Distance walked: 300' Assistive device utilized: Single point cane Level of assistance: Modified independence Comments: limited with recent lt knee meniscal tear and impaired gait mechanics with this and antalgic gait.  10/05/2021 no Single point cane today.    POSTURE:  Mild rounded shoulders, posterior pelvic tilt    LUMBARAROM/PROM   Decreased by 25% in all directions   LE ROM:   WFL, Lt knee limited due to pain   LE MMT:   Bil hips grossly 4/5; Lt knee limited due to pain; Rt knee 5/5; ankles 5/5   PELVIC MMT:   MMT   09/26/2021  Vaginal 4/5; 4s; 3 reps  Internal Anal Sphincter    External Anal Sphincter    Puborectalis    Diastasis Recti    (Blank rows = not tested)         PALPATION:   General  no TTP but mild fascial restriction  External Perineal Exam no TTP                             Internal Pelvic Floor no TTP   TONE: WFL   PROLAPSE: Not seen in hooklying    TODAY'S TREATMENT   10/05/2021: Stretching with strap: hamstring, adductor, abductor 2x30s bil Happy baby 2x30s Bridges with ball squeeze 2x10 Sidelying hip abduction with ball press 2x10 each side All exercises cued for breathing and pelvic floor mechanics and coordination for decreased strain at pelvic floor and improved pelvic floor strength to decrease leakage.   Pt also continued to be educated on breathing mechanics, voiding mechanics, and bladder retraining   09/26/2021 EVAL Examination completed, findings reviewed, pt educated on POC, HEP, bladder irritants and urge drill. Pt motivated to participate in PT and agreeable to attempt recommendations.                       If treatment provided at initial evaluation, no treatment charged due to lack of authorization.                               PATIENT EDUCATION:  Education details: [id number] Person educated: Patient Education method: Explanation, Demonstration, Tactile cues, Verbal cues, and Handouts Education comprehension: verbalized understanding and returned demonstration     HOME EXERCISE PROGRAM: [email or web address]   ASSESSMENT:   CLINICAL IMPRESSION: Patient reports to clinic she has not had leakage and urge drill has been helping overall. Pt session focused on stretching, hip and core strengthening with coordination of pelvic floor and breathing to decrease strain at pelvic floor and decrease leakage. Pt tolerated well did need cues to complete for improved technique. Pt would benefit from additional PT to further address deficits.       OBJECTIVE IMPAIRMENTS decreased coordination, decreased endurance, decreased mobility, difficulty walking, decreased strength, increased fascial restrictions, improper body mechanics, and postural dysfunction.    ACTIVITY LIMITATIONS community activity, driving, and yard work.    PERSONAL FACTORS Time since onset of injury/illness/exacerbation and 1 comorbidity: one vaginal birth with episiotomy   are also affecting patient's functional outcome.      REHAB POTENTIAL: Good   CLINICAL DECISION MAKING: Stable/uncomplicated   EVALUATION COMPLEXITY: Low     GOALS: Goals reviewed with patient? Yes   SHORT TERM GOALS: Target date: 10/24/2021  (Remove Blue Hyperlink)   Pt to be I with HEP.  Baseline: Goal status: INITIAL   2.  Pt will have 25% less urgency due to bladder retraining and strengthening  Baseline:  Goal status: INITIAL   3.  Pt to report improved void frequency to no more than 3 nighttime voids for improved QOL with decreased urinary frequency.   Baseline:  Goal status: INITIAL       LONG TERM GOALS: Target date: 12/27/2021  (Remove Blue Hyperlink)   Pt to be I with advanced HEP.  Baseline: given at eval Goal status: INITIAL   2.  Pt will have 50% less urgency due to  bladder retraining and strengthening  Baseline: 100% of the time  Goal status: INITIAL   3.  Pt to report improved void frequency to no more than 2 nighttime voids for improved QOL with decreased urinary frequency.   Baseline: 5 times Goal status: INITIAL   4.  Pt to report improved time between bladder voids to at least 3-4  hours with minimal urgency and not leakage for improved QOL with decreased urinary frequency.   Baseline: sometimes 45 mins sometimes 3 hours per pt Goal status: INITIAL   5.  Pt to demonstrate at least 5/5 bil hip strength for improved pelvic stability and functional squats without leakage.  Baseline:  Goal status: INITIAL   6.  Pt to demonstrated improved coordination of pelvic floor and breathing mechanics for 15# squat without leakage for improved pelvic stability and decreased leakage.  Baseline: unable Goal status: INITIAL   PLAN: PT FREQUENCY: 1x/week   PT DURATION:  8 sessions   PLANNED INTERVENTIONS: Therapeutic exercises, Therapeutic activity, Neuromuscular re-education, Patient/Family education, Joint mobilization, Dry Needling, Spinal mobilization, Cryotherapy, Moist heat, Manual lymph drainage, scar mobilization, Taping, Biofeedback, and Manual therapy   PLAN FOR NEXT SESSION: coordination of pelvic floor with strengthening, voiding mechanics, breathing mechanics     Otelia SergeantHaley Akshara Blumenthal, PT, DPT 10/05/2308:57 AM

## 2021-10-11 ENCOUNTER — Encounter: Payer: Medicaid Other | Admitting: Physical Therapy

## 2021-10-11 ENCOUNTER — Telehealth: Payer: Self-pay | Admitting: Physical Therapy

## 2021-10-11 ENCOUNTER — Ambulatory Visit: Payer: Medicaid Other | Admitting: Physical Therapy

## 2021-10-11 NOTE — Telephone Encounter (Signed)
PT called pt about this morning's appointment at [facility]. Pt did not answer, voicemail left.    [name], PT, DPT 05/25/239:24 AM

## 2021-10-24 ENCOUNTER — Encounter: Payer: Medicaid Other | Admitting: Physical Therapy

## 2021-10-30 ENCOUNTER — Encounter: Payer: Medicaid Other | Admitting: Physical Therapy

## 2021-10-31 ENCOUNTER — Ambulatory Visit: Payer: Medicaid Other | Attending: Urology | Admitting: Physical Therapy

## 2021-10-31 DIAGNOSIS — R293 Abnormal posture: Secondary | ICD-10-CM | POA: Diagnosis present

## 2021-10-31 DIAGNOSIS — R279 Unspecified lack of coordination: Secondary | ICD-10-CM | POA: Diagnosis present

## 2021-10-31 DIAGNOSIS — M6281 Muscle weakness (generalized): Secondary | ICD-10-CM | POA: Diagnosis present

## 2021-10-31 NOTE — Therapy (Signed)
OUTPATIENT PHYSICAL THERAPY TREATMENT NOTE   Patient Name: Maria Reid MRN: [id number] DOB:[birth year], 51 y.o., female Today's Date: 10/31/2021  PCP: Andi Devon MD REFERRING PROVIDER: Alfredo Martinez, MD  END OF SESSION:   PT End of Session - 10/31/21 0940     Visit Number 3    Date for PT Re-Evaluation 12/27/21    Authorization Type medicaid healthy blue    PT Start Time 0939    PT Stop Time 1018    PT Time Calculation (min) 39 min    Activity Tolerance Patient tolerated treatment well    Behavior During Therapy WFL for tasks assessed/performed             Past Medical History:  Diagnosis Date   Anxiety    Asthma    Depression    GERD (gastroesophageal reflux disease)    Herpes    Hypertension    Past Surgical History:  Procedure Laterality Date   LASIK     NASAL SEPTUM SURGERY     Patient Active Problem List   Diagnosis Date Noted   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   PAIN:  Are you having pain? NO    OBJECTIVE: (objective measures completed at initial evaluation unless otherwise dated)   DIAGNOSTIC FINDINGS:      COGNITION:            Overall cognitive status: Within functional limits for tasks assessed                          SENSATION:            Light touch: Appears intact            Proprioception: Appears intact   MUSCLE LENGTH: Bil hamstrings and adductors limited by 50%     FUNCTIONAL TESTS:  Functional  squat: pt limited with Lt knee recent meniscal tear per pt and unable to complete    GAIT: Distance walked: 300' Assistive device utilized: Single point cane Level of assistance: Modified independence Comments: limited with recent lt knee meniscal tear and impaired gait mechanics with this and antalgic gait.  10/05/2021 no Single point cane today.    POSTURE:  Mild rounded shoulders, posterior pelvic tilt    LUMBARAROM/PROM   Decreased by 25% in all directions   LE ROM:   WFL, Lt knee limited due to pain   LE MMT:   Bil hips grossly 4/5; Lt knee limited due to pain; Rt knee 5/5; ankles 5/5   PELVIC MMT:   MMT   09/26/2021  Vaginal 4/5; 4s; 3 reps  Internal Anal Sphincter    External Anal Sphincter    Puborectalis    Diastasis Recti    (Blank rows = not tested)         PALPATION:   General  no TTP but mild fascial restriction                 External Perineal Exam no TTP  Internal Pelvic Floor no TTP   TONE: WFL   PROLAPSE: Not seen in hooklying    TODAY'S TREATMENT   10/31/2021: Henreitta Leber with ball squeeze 2x10 Sidelying hip abduction with ball press 2x10 Hooklying opp hand/knee ball press 2x10 Squats 10# 2x10 Mario punches 4# x10 Green band palloffs 2x10 Green band rotation palloffs 2x10 All exercises cued for pelvic floor and breathing coordination for improved pelvic floor   10/05/2021: Stretching with strap: hamstring, adductor, abductor 2x30s bil Happy baby 2x30s Bridges with ball squeeze 2x10 Sidelying hip abduction with ball press 2x10 each side All exercises cued for breathing and pelvic floor mechanics and coordination for decreased strain at pelvic floor and improved pelvic floor strength to decrease leakage.   Pt also continued to be educated on breathing mechanics, voiding mechanics, and bladder retraining   09/26/2021 EVAL Examination completed, findings reviewed, pt educated on POC, HEP, bladder irritants and urge  drill. Pt motivated to participate in PT and agreeable to attempt recommendations.                       If treatment provided at initial evaluation, no treatment charged due to lack of authorization.                              PATIENT EDUCATION:  Education details: [id number] Person educated: Patient Education method: Explanation, Demonstration, Tactile cues, Verbal cues, and Handouts Education comprehension: verbalized understanding and returned demonstration     HOME EXERCISE PROGRAM: [id number]   ASSESSMENT:   CLINICAL IMPRESSION: Patient reports she continues to have improvement with urge drill and HEP, still having no leakage except one day forgot medication and had small leakage instance. Pt has follow up with referring provider today and plans to go over improvement with him too. Pt session focused on stretching, hip and core strengthening with coordination of pelvic floor and breathing to decrease strain at pelvic floor and decrease leakage. Pt tolerated well did need cues to complete for improved technique. Pt would benefit from additional PT to further address deficits.       OBJECTIVE IMPAIRMENTS decreased coordination, decreased endurance, decreased mobility, difficulty walking, decreased strength, increased fascial restrictions, improper body mechanics, and postural dysfunction.    ACTIVITY LIMITATIONS community activity, driving, and yard work.    PERSONAL FACTORS Time since onset of injury/illness/exacerbation and 1 comorbidity: one vaginal birth with episiotomy   are also affecting patient's functional outcome.      REHAB POTENTIAL: Good   CLINICAL DECISION MAKING: Stable/uncomplicated   EVALUATION COMPLEXITY: Low     GOALS: Goals reviewed with patient? Yes   SHORT TERM GOALS: Target date: 10/24/2021  (Remove Blue Hyperlink)   Pt to be I with HEP.  Baseline: Goal status: MET   2.  Pt will have 25% less urgency due to bladder retraining and strengthening   Baseline:  Goal status: MET   3.  Pt to report improved void frequency to no more than 3 nighttime voids for improved QOL with decreased urinary frequency.   Baseline:  Goal status: MET       LONG TERM GOALS: Target date: 12/27/2021  (Remove Blue Hyperlink)   Pt to be I with advanced HEP.  Baseline: given at eval Goal status: INITIAL   2.  Pt will have 50% less urgency due to bladder retraining and strengthening  Baseline: 100% of the time  Goal status: INITIAL   3.  Pt to report improved void frequency to no more than 2 nighttime voids for improved QOL with decreased urinary frequency.   Baseline: 5 times Goal status: INITIAL   4.  Pt to report improved time between bladder voids to at least 3-4 hours with minimal urgency and not leakage for improved QOL with decreased urinary frequency.   Baseline: sometimes 45 mins sometimes 3 hours per pt Goal status: INITIAL   5.  Pt to demonstrate at least 5/5 bil hip strength for improved pelvic stability and functional squats without leakage.  Baseline:  Goal status: INITIAL   6.  Pt to demonstrated improved coordination of pelvic floor and breathing mechanics for 15# squat without leakage for improved pelvic stability and decreased leakage.  Baseline: unable Goal status: INITIAL   PLAN: PT FREQUENCY: 1x/week   PT DURATION:  8 sessions   PLANNED INTERVENTIONS: Therapeutic exercises, Therapeutic activity, Neuromuscular re-education, Patient/Family education, Joint mobilization, Dry Needling, Spinal mobilization, Cryotherapy, Moist heat, Manual lymph drainage, scar mobilization, Taping, Biofeedback, and Manual therapy   PLAN FOR NEXT SESSION: coordination of pelvic floor with strengthening, voiding mechanics, breathing mechanics     [name], PT, DPT 10/31/2308:21 AM

## 2021-11-05 ENCOUNTER — Encounter: Payer: Medicaid Other | Admitting: Physical Therapy

## 2021-11-06 ENCOUNTER — Ambulatory Visit: Payer: Medicaid Other | Admitting: Physical Therapy

## 2021-11-06 DIAGNOSIS — R279 Unspecified lack of coordination: Secondary | ICD-10-CM

## 2021-11-06 DIAGNOSIS — M6281 Muscle weakness (generalized): Secondary | ICD-10-CM | POA: Diagnosis not present

## 2021-11-06 NOTE — Therapy (Signed)
OUTPATIENT PHYSICAL THERAPY TREATMENT NOTE   Patient Name: Maria Reid MRN: [id number] DOB:December 16, 1970, 51 y.o., female Today's Date: 11/06/2021  PCP: Willey Blade MD REFERRING PROVIDER: Bjorn Loser, MD  END OF SESSION:   PT End of Session - 11/06/21 1025     Visit Number 4    Date for PT Re-Evaluation 12/27/21    Authorization Type medicaid healthy blue    PT Start Time 1019    PT Stop Time 1057    PT Time Calculation (min) 38 min    Activity Tolerance Patient tolerated treatment well    Behavior During Therapy WFL for tasks assessed/performed              Past Medical History:  Diagnosis Date   Anxiety    Asthma    Depression    GERD (gastroesophageal reflux disease)    Herpes    Hypertension    Past Surgical History:  Procedure Laterality Date   LASIK     NASAL SEPTUM SURGERY     Patient Active Problem List   Diagnosis Date Noted   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   PAIN:  Are you having pain? NO    OBJECTIVE: (objective measures completed at initial evaluation unless otherwise dated)   DIAGNOSTIC FINDINGS:      COGNITION:            Overall cognitive status: Within functional limits for tasks assessed                          SENSATION:            Light touch: Appears intact            Proprioception: Appears intact   MUSCLE LENGTH: Bil hamstrings and adductors limited by 50%     FUNCTIONAL TESTS:  Functional squat: pt limited with Lt knee recent meniscal tear per pt and unable to complete    GAIT: Distance walked:  300' Assistive device utilized: Single point cane Level of assistance: Modified independence Comments: limited with recent lt knee meniscal tear and impaired gait mechanics with this and antalgic gait.  10/05/2021 no Single point cane today.    POSTURE:  Mild rounded shoulders, posterior pelvic tilt    LUMBARAROM/PROM   Decreased by 25% in all directions   LE ROM:   WFL, Lt knee limited due to pain   LE MMT:   Bil hips grossly 4/5; Lt knee limited due to pain; Rt knee 5/5; ankles 5/5   PELVIC MMT:   MMT   09/26/2021  Vaginal 4/5; 4s; 3 reps  Internal Anal Sphincter    External Anal Sphincter    Puborectalis    Diastasis Recti    (Blank rows = not tested)         PALPATION:   General  no TTP but mild fascial restriction                 External Perineal Exam no TTP  Internal Pelvic Floor no TTP   TONE: WFL   PROLAPSE: Not seen in hooklying    TODAY'S TREATMENT   11/06/21: Maria Reid with ball squeeze x20 Sidelying hip abduction and ball press x20  Bird dogs 2x10 each leg Squats 15# x20 Toe taps holding 15# x20 Rotational palloffs blue band x20 bil Blue band cauldron stirs x10 each 2x10 blue band TA activations with bil shoulder horizontal abductions Shoulder diagonals blue band x10 Nustep 6 mins L9 All exercises cued for pelvic floor and breathing coordination for improved pelvic floor Pt denied internal as she is no longer having symptoms.    10/31/2021: Bridges with ball squeeze 2x10 Sidelying hip abduction with ball press 2x10 Hooklying opp hand/knee ball press 2x10 Squats 10# 2x10 Maria Reid punches 4# x10 Green band palloffs 2x10 Green band rotation palloffs 2x10 All exercises cued for pelvic floor and breathing coordination for improved pelvic floor   10/05/2021: Stretching with strap: hamstring, adductor, abductor 2x30s bil Happy baby 2x30s Bridges with ball squeeze 2x10 Sidelying hip abduction with ball press 2x10 each  side All exercises cued for breathing and pelvic floor mechanics and coordination for decreased strain at pelvic floor and improved pelvic floor strength to decrease leakage.   Pt also continued to be educated on breathing mechanics, voiding mechanics, and bladder retraining                       If treatment provided at initial evaluation, no treatment charged due to lack of authorization.                              PATIENT EDUCATION:  Education details: [id number] Person educated: Patient Education method: Explanation, Demonstration, Tactile cues, Verbal cues, and Handouts Education comprehension: verbalized understanding and returned demonstration     HOME EXERCISE PROGRAM: [id number]   ASSESSMENT:   CLINICAL IMPRESSION: Patient reports she is no longer having symptoms and pleased with progress, feels all recommendations and HEP have helped a lot. Pt session focused on hip and core strengthening with minimal cues for breathing coordination but overall reports she has done better with this. Pt tolerated well and has met all goals. This will serve as pt's DC from PT.      OBJECTIVE IMPAIRMENTS decreased coordination, decreased endurance, decreased mobility, difficulty walking, decreased strength, increased fascial restrictions, improper body mechanics, and postural dysfunction.    ACTIVITY LIMITATIONS community activity, driving, and yard work.    PERSONAL FACTORS Time since onset of injury/illness/exacerbation and 1 comorbidity: one vaginal birth with episiotomy   are also affecting patient's functional outcome.      REHAB POTENTIAL: Good   CLINICAL DECISION MAKING: Stable/uncomplicated   EVALUATION COMPLEXITY: Low     GOALS: Goals reviewed with patient? Yes   SHORT TERM GOALS: Target date: 10/24/2021  (Remove Blue Hyperlink)   Pt to be I with HEP.  Baseline: Goal status: MET   2.  Pt will have 25% less urgency due to bladder retraining and strengthening  Baseline:   Goal status: MET   3.  Pt to report improved void frequency to no more than 3 nighttime voids for improved QOL with decreased urinary frequency.   Baseline:  Goal status: MET       LONG TERM GOALS: Target date: 12/27/2021  (Remove Blue Hyperlink)   Pt to be I with advanced HEP.  Baseline: given at eval Goal status: MET   2.  Pt  will have 50% less urgency due to bladder retraining and strengthening  Baseline: 100% of the time  Goal status: MET - doesn't feel urgency now   3.  Pt to report improved void frequency to no more than 2 nighttime voids for improved QOL with decreased urinary frequency.   Baseline: 5 times Goal status: MET 0-1   4.  Pt to report improved time between bladder voids to at least 3-4 hours with minimal urgency and not leakage for improved QOL with decreased urinary frequency.   Baseline: sometimes 45 mins sometimes 3 hours per pt Goal status: MET at least 3 most of the time 3.5-4 hour    5.  Pt to demonstrate at least 5/5 bil hip strength for improved pelvic stability and functional squats without leakage.  Baseline:  Goal status: MET   6.  Pt to demonstrated improved coordination of pelvic floor and breathing mechanics for 15# squat without leakage for improved pelvic stability and decreased leakage.  Baseline: unable Goal status: MET   PLAN: PT FREQUENCY: 1x/week   PT DURATION:  8 sessions   PLANNED INTERVENTIONS: Therapeutic exercises, Therapeutic activity, Neuromuscular re-education, Patient/Family education, Joint mobilization, Dry Needling, Spinal mobilization, Cryotherapy, Moist heat, Manual lymph drainage, scar mobilization, Taping, Biofeedback, and Manual therapy   PLAN FOR NEXT SESSION: coordination of pelvic floor with strengthening, voiding mechanics, breathing mechanics   PHYSICAL THERAPY DISCHARGE SUMMARY  Visits from Start of Care: 4  Current functional level related to goals / functional outcomes: All goals met   Remaining  deficits: No longer having symptoms   Education / Equipment: HEP   Patient agrees to discharge. Patient goals were met. Patient is being discharged due to meeting the stated rehab goals.   [name], PT, DPT 11/06/2308:59 AM

## 2021-11-13 ENCOUNTER — Encounter: Payer: Medicaid Other | Admitting: Physical Therapy

## 2021-11-14 ENCOUNTER — Ambulatory Visit: Payer: Medicaid Other | Admitting: Physical Therapy

## 2021-11-21 ENCOUNTER — Encounter: Payer: Medicaid Other | Admitting: Physical Therapy

## 2021-11-23 ENCOUNTER — Ambulatory Visit: Payer: Medicaid Other | Admitting: Physical Therapy

## 2021-11-27 ENCOUNTER — Encounter: Payer: Medicaid Other | Admitting: Physical Therapy

## 2021-11-28 ENCOUNTER — Ambulatory Visit: Payer: Medicaid Other | Admitting: Physical Therapy

## 2021-12-06 ENCOUNTER — Ambulatory Visit: Payer: Medicaid Other | Admitting: Physical Therapy

## 2022-01-26 ENCOUNTER — Emergency Department (HOSPITAL_BASED_OUTPATIENT_CLINIC_OR_DEPARTMENT_OTHER)
Admission: EM | Admit: 2022-01-26 | Discharge: 2022-01-26 | Disposition: A | Discharge status: Home / Self Care | Payer: Medicaid Other | Attending: Emergency Medicine | Admitting: Emergency Medicine

## 2022-01-26 ENCOUNTER — Encounter (HOSPITAL_BASED_OUTPATIENT_CLINIC_OR_DEPARTMENT_OTHER): Payer: Self-pay

## 2022-01-26 ENCOUNTER — Other Ambulatory Visit: Payer: Self-pay

## 2022-01-26 DIAGNOSIS — R040 Epistaxis: Secondary | ICD-10-CM | POA: Insufficient documentation

## 2022-01-26 DIAGNOSIS — I10 Essential (primary) hypertension: Secondary | ICD-10-CM | POA: Diagnosis not present

## 2022-01-26 LAB — PROTIME-INR
INR: 1 (ref 0.8–1.2)
Prothrombin Time: 13.4 seconds (ref 11.4–15.2)

## 2022-01-26 LAB — CBC WITH DIFFERENTIAL/PLATELET
Abs Immature Granulocytes: 0.02 10*3/uL (ref 0.00–0.07)
Basophils Absolute: 0.1 10*3/uL (ref 0.0–0.1)
Basophils Relative: 1 %
Eosinophils Absolute: 0.1 10*3/uL (ref 0.0–0.5)
Eosinophils Relative: 2 %
HCT: 35.5 % — ABNORMAL LOW (ref 36.0–46.0)
Hemoglobin: 11.9 g/dL — ABNORMAL LOW (ref 12.0–15.0)
Immature Granulocytes: 0 %
Lymphocytes Relative: 35 %
Lymphs Abs: 3.1 10*3/uL (ref 0.7–4.0)
MCH: 31.4 pg (ref 26.0–34.0)
MCHC: 33.5 g/dL (ref 30.0–36.0)
MCV: 93.7 fL (ref 80.0–100.0)
Monocytes Absolute: 0.5 10*3/uL (ref 0.1–1.0)
Monocytes Relative: 6 %
Neutro Abs: 5.1 10*3/uL (ref 1.7–7.7)
Neutrophils Relative %: 56 %
Platelets: 295 10*3/uL (ref 150–400)
RBC: 3.79 MIL/uL — ABNORMAL LOW (ref 3.87–5.11)
RDW: 11.8 % (ref 11.5–15.5)
WBC: 9 10*3/uL (ref 4.0–10.5)
nRBC: 0 % (ref 0.0–0.2)

## 2022-01-26 LAB — BASIC METABOLIC PANEL
Anion gap: 9 (ref 5–15)
BUN: 12 mg/dL (ref 6–20)
CO2: 26 mmol/L (ref 22–32)
Calcium: 10.1 mg/dL (ref 8.9–10.3)
Chloride: 103 mmol/L (ref 98–111)
Creatinine, Ser: 1 mg/dL (ref 0.44–1.00)
GFR, Estimated: >60 mL/min (ref >60)
Glucose, Bld: 94 mg/dL (ref 70–99)
Potassium: 3.7 mmol/L (ref 3.5–5.1)
Sodium: 138 mmol/L (ref 135–145)

## 2022-01-26 NOTE — Discharge Instructions (Signed)
Use affrin as needed.

## 2022-01-26 NOTE — ED Provider Notes (Signed)
[facility] EMERGENCY DEPT Provider Note   CSN: [id number] Arrival date & time: 01/26/22  1339     History Chief Complaint  Patient presents with   Epistaxis    HPI Maria Reid is a 51 y.o. female presenting for chief complaint of epistaxis.  She denies fevers or chills nausea vomiting syncope shortness of breath.  She otherwise ambulatory tolerating p.o. intake.  She states that she has had 5 episodes of severe epistaxis earlier this week including episode of large-volume epistaxis complicated by coughing up blood. She has a history of chronic sinus disease followed by ENT in the past..  Patient's recorded medical, surgical, social, medication list and allergies were reviewed in the Snapshot window as part of the initial history.   Review of Systems   Review of Systems  Constitutional:  Negative for chills and fever.  HENT:  Positive for nosebleeds. Negative for ear pain and sore throat.   Eyes:  Negative for pain and visual disturbance.  Respiratory:  Negative for cough and shortness of breath.   Cardiovascular:  Negative for chest pain and palpitations.  Gastrointestinal:  Negative for abdominal pain and vomiting.  Genitourinary:  Negative for dysuria and hematuria.  Musculoskeletal:  Negative for arthralgias and back pain.  Skin:  Negative for color change and rash.  Neurological:  Negative for seizures and syncope.  All other systems reviewed and are negative.   Physical Exam Updated Vital Signs BP (!) 142/89 (BP Location: Right Arm)   Pulse 82   Temp 98 F (36.7 C) (Oral)   Resp 16   SpO2 97%  Physical Exam Vitals and nursing note reviewed.  Constitutional:      General: She is not in acute distress.    Appearance: She is well-developed.  HENT:     Head: Normocephalic and atraumatic.  Eyes:     Conjunctiva/sclera: Conjunctivae normal.  Cardiovascular:     Rate and Rhythm: Normal rate and regular rhythm.     Heart sounds: No murmur  heard. Pulmonary:     Effort: Pulmonary effort is normal. No respiratory distress.     Breath sounds: Normal breath sounds.  Abdominal:     General: There is no distension.     Palpations: Abdomen is soft.     Tenderness: There is no abdominal tenderness. There is no right CVA tenderness or left CVA tenderness.  Musculoskeletal:        General: No swelling or tenderness. Normal range of motion.     Cervical back: Neck supple.  Skin:    General: Skin is warm and dry.  Neurological:     General: No focal deficit present.     Mental Status: She is alert and oriented to person, place, and time. Mental status is at baseline.     Cranial Nerves: No cranial nerve deficit.      ED Course/ Medical Decision Making/ A&P    Procedures Procedures   Medications Ordered in ED Medications - No data to display  Medical Decision Making:    TAKASHA VETERE is a 51 y.o. female who presented to the ED today with episodic epistaxis detailed above.     Patient's presentation is complicated by their history of multiple comorbid medical conditions including hypertension, sinusitis in the past..  Patient placed on continuous vitals and telemetry monitoring while in ED which was reviewed periodically.   Complete initial physical exam performed, notably the patient  was hemodynamically stable in no acute distress, no evidence of  active bleeding at this time.      Reviewed and confirmed nursing documentation for past medical history, family history, social history.    Initial Assessment:   With the patient's presentation of epistaxis, most likely diagnosis is anterior. Other diagnoses were considered including (but not limited to) posterior epistaxis.  Currently patient is asymptomatic and hemostatic.  Will evaluate for underlying coagulopathy as below. These are considered less likely due to history of present illness and physical exam findings.   This is most consistent with an acute life/limb  threatening illness complicated by underlying chronic conditions.  Initial Plan:  Screening labs including CBC and Metabolic panel to evaluate for infectious or metabolic etiology of disease.  Including thrombocytopenia or uremic coagulopathy Will evaluate for primary coagulopathy with INR Objective evaluation as below reviewed with plan for close reassessment  Initial Study Results:   Laboratory  All laboratory results reviewed without evidence of clinically relevant pathology.    Final Assessment and Plan:   On reassessment, patient remains without ongoing epistaxis.  Recommended close follow-up with ENT in the outpatient setting given severe number of episodes and history of specialty follow-up in the past.  Patient discharged with no further acute events stable for continued outpatient care and management.     Clinical Impression:  1. Epistaxis      Data Unavailable   Final Clinical Impression(s) / ED Diagnoses Final diagnoses:  Epistaxis    Rx / DC Orders ED Discharge Orders          Ordered    Ambulatory referral to ENT        01/26/22 1416              Glyn Ade, MD 01/26/22 1523

## 2022-01-26 NOTE — ED Notes (Signed)
Discharge instructions and follow up care with ENT reviewed and explained, pt verbalized understanding and had no further questions on d/c.

## 2022-01-26 NOTE — ED Triage Notes (Signed)
She reports bilat. Epistaxes, intermittent, x 3-4 days. She has been treating this with Afrin with good results. She states that today she passed "a large blood clot". She is not bleeding at present.

## 2022-08-10 IMAGING — MG MM DIGITAL DIAGNOSTIC UNILAT*R* W/ TOMO W/ CAD
8 series · 9 of 24 positions shown · non-contrast
Comparison: Previous exam(s).

CLINICAL DATA: Screening recall for a possible right breast
distortion.

EXAM:
DIGITAL DIAGNOSTIC UNILATERAL RIGHT MAMMOGRAM WITH TOMOSYNTHESIS AND
CAD
TECHNIQUE: Right digital diagnostic mammography and breast tomosynthesis was
performed. The images were evaluated with computer-aided detection.

[R CC synth-2D]
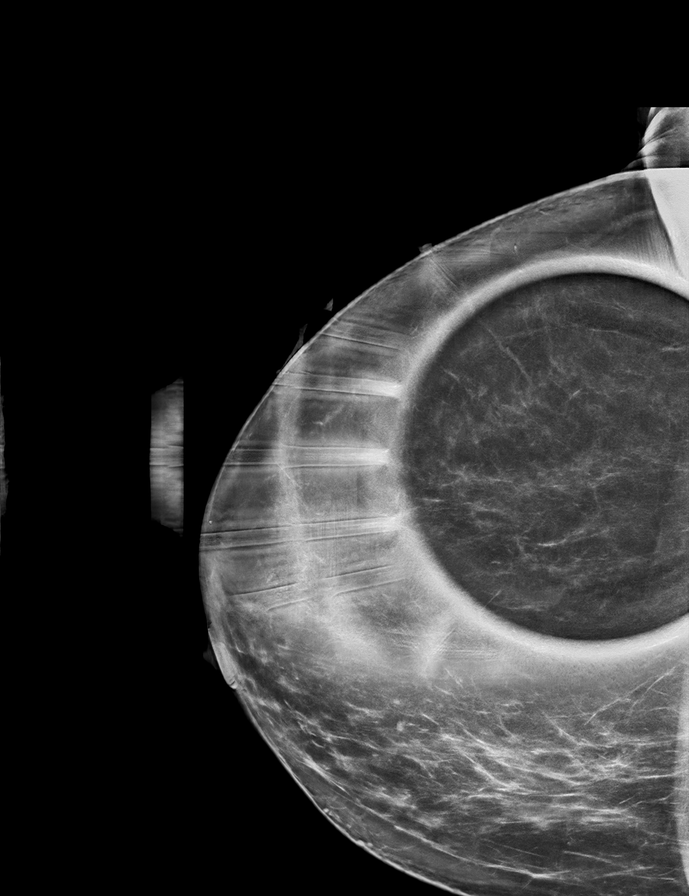

[R ML synth-2D (1 of 2)]
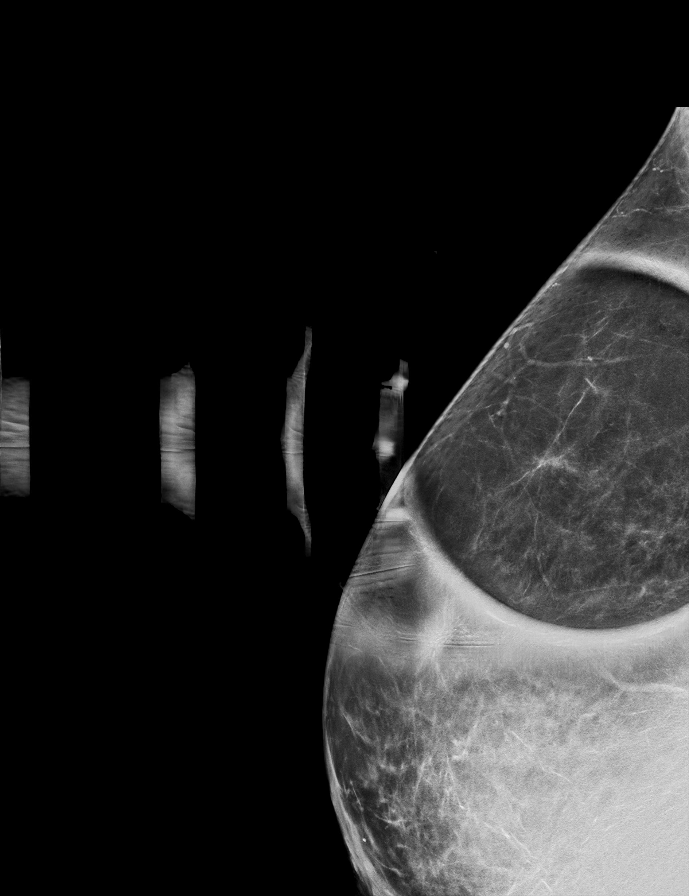

[R MLO synth-2D]
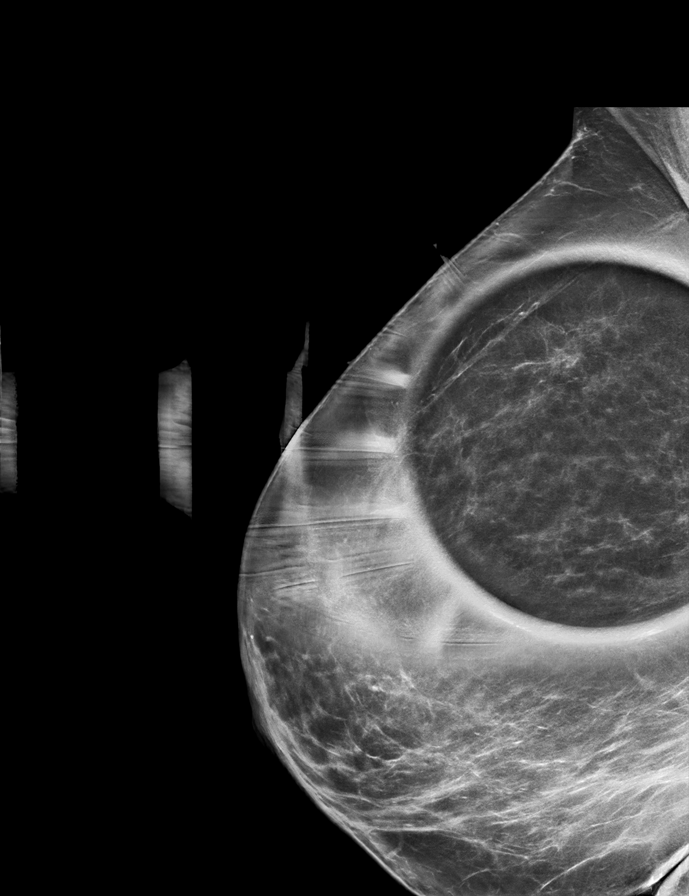

[R ML synth-2D (2 of 2)]
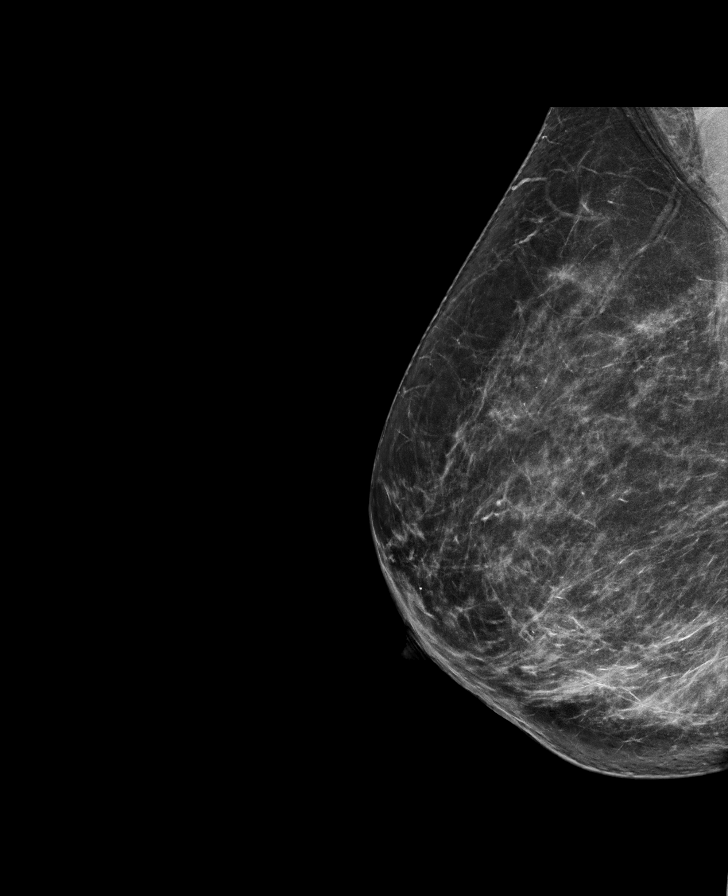

[R CC tomo · 2 of 87 frames shown]
[frame 29/87]
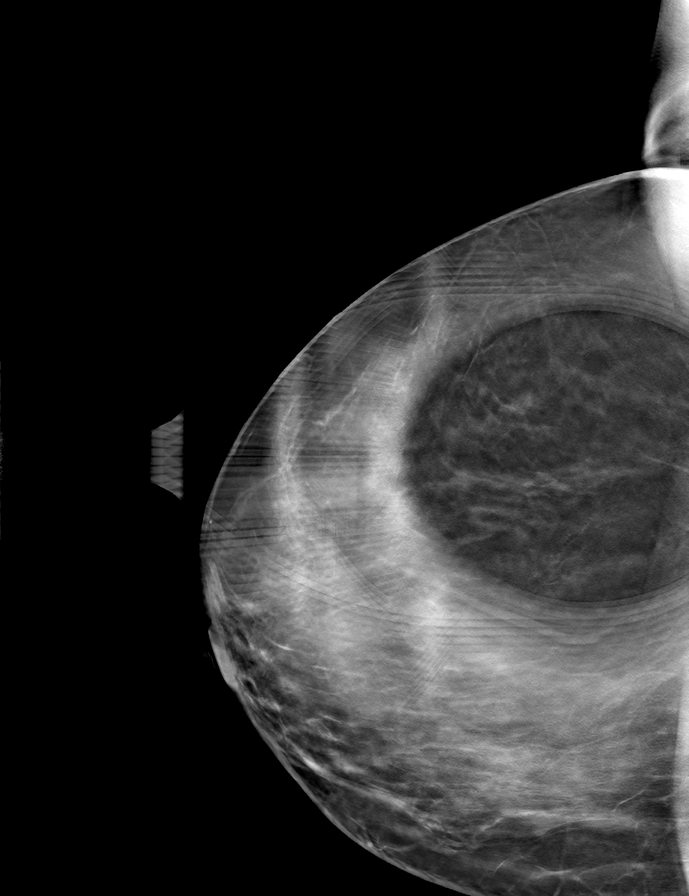
[frame 44/87]
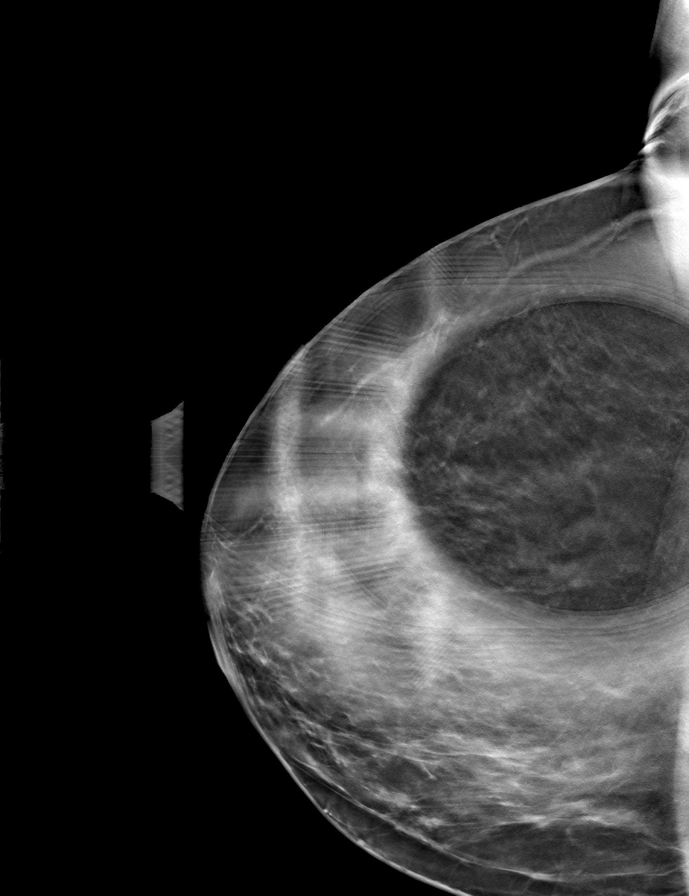

[R ML tomo (1 of 2) · tomo slice 39/76.0]
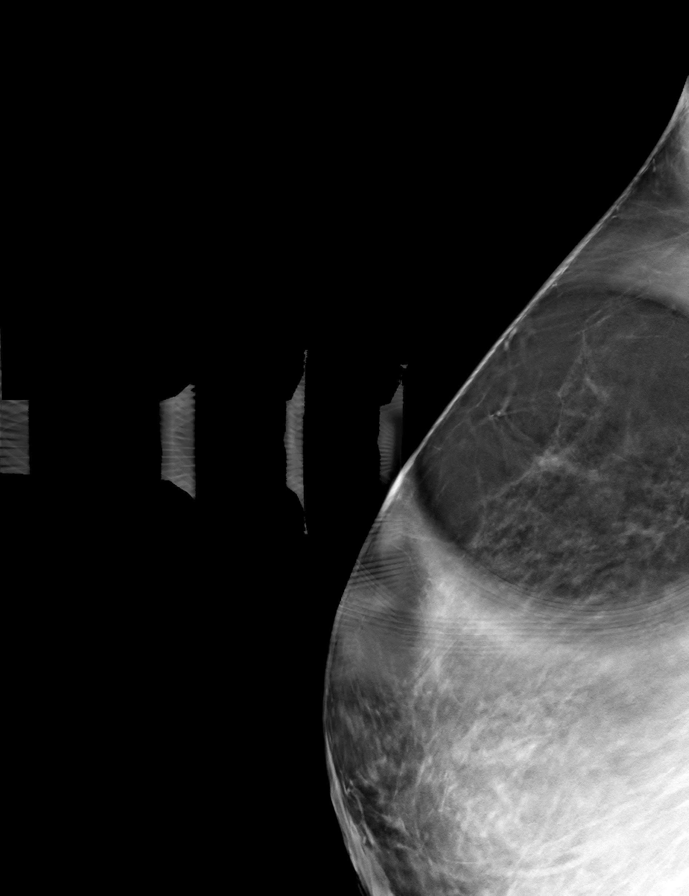

[R MLO tomo · tomo slice 41/82.0]
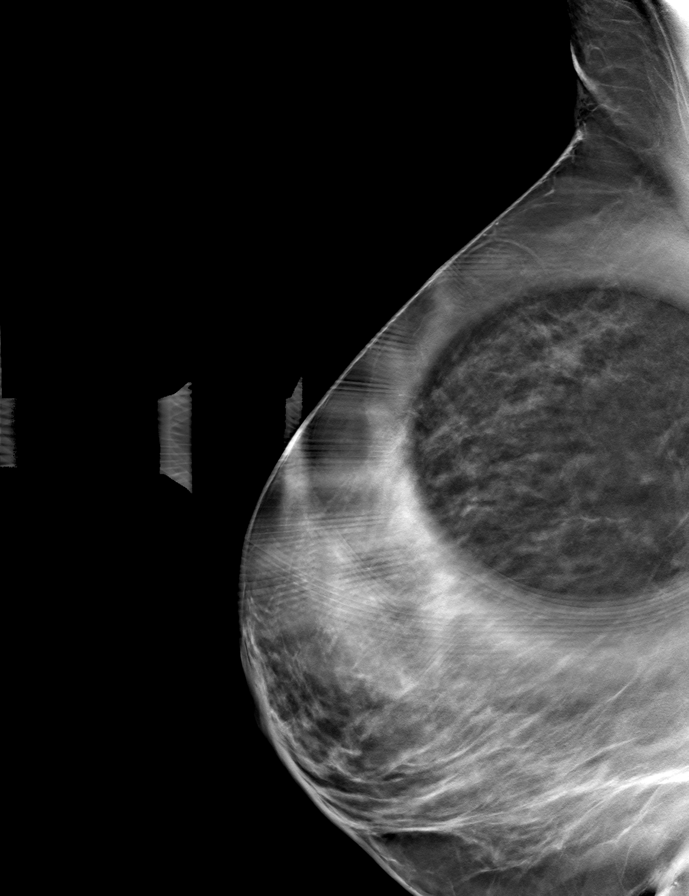

[R ML tomo (2 of 2) · tomo slice 41/80.0]
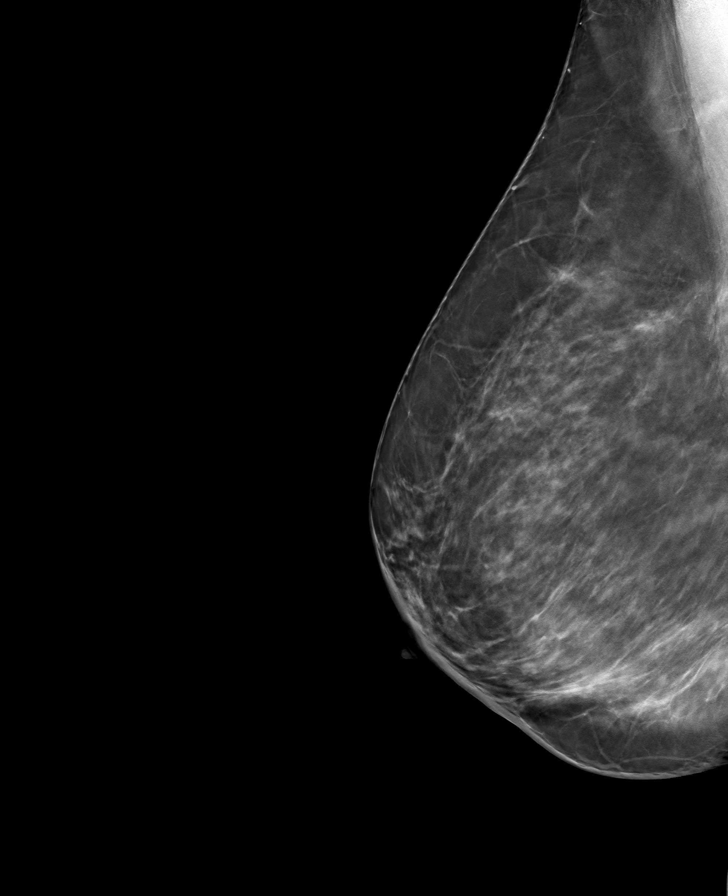

[9 of 24 positions shown; findings below may reference images not displayed]

ACR Breast Density Category c: The breast tissue is heterogeneously
dense, which may obscure small masses.
FINDINGS: Spot compression tomosynthesis images through the superior aspect of
the right breast demonstrates no persistent suspicious findings of
distortion. No suspicious calcifications, masses or areas of
distortion are seen in the right breast.
IMPRESSION: Resolution of the right breast distortion consistent with
overlapping fibroglandular tissue.

RECOMMENDATION:
Screening mammogram in one year.(Code:95-W-XG3)

I have discussed the findings and recommendations with the patient.
If applicable, a reminder letter will be sent to the patient
regarding the next appointment.

BI-RADS CATEGORY  1: Negative.

## 2023-07-25 ENCOUNTER — Other Ambulatory Visit: Payer: Self-pay | Admitting: Obstetrics and Gynecology

## 2023-07-25 DIAGNOSIS — Z1231 Encounter for screening mammogram for malignant neoplasm of breast: Secondary | ICD-10-CM

## 2023-08-28 ENCOUNTER — Emergency Department (HOSPITAL_BASED_OUTPATIENT_CLINIC_OR_DEPARTMENT_OTHER)
Admission: EM | Admit: 2023-08-28 | Discharge: 2023-08-28 | Disposition: A | Discharge status: Home / Self Care | Payer: Self-pay | Attending: Emergency Medicine | Admitting: Emergency Medicine

## 2023-08-28 ENCOUNTER — Encounter (HOSPITAL_BASED_OUTPATIENT_CLINIC_OR_DEPARTMENT_OTHER): Payer: Self-pay | Admitting: Emergency Medicine

## 2023-08-28 ENCOUNTER — Emergency Department (HOSPITAL_BASED_OUTPATIENT_CLINIC_OR_DEPARTMENT_OTHER): Payer: Self-pay

## 2023-08-28 ENCOUNTER — Other Ambulatory Visit: Payer: Self-pay

## 2023-08-28 DIAGNOSIS — R197 Diarrhea, unspecified: Secondary | ICD-10-CM | POA: Insufficient documentation

## 2023-08-28 DIAGNOSIS — R509 Fever, unspecified: Secondary | ICD-10-CM | POA: Insufficient documentation

## 2023-08-28 DIAGNOSIS — R630 Anorexia: Secondary | ICD-10-CM | POA: Insufficient documentation

## 2023-08-28 DIAGNOSIS — R112 Nausea with vomiting, unspecified: Secondary | ICD-10-CM | POA: Insufficient documentation

## 2023-08-28 DIAGNOSIS — E860 Dehydration: Secondary | ICD-10-CM | POA: Insufficient documentation

## 2023-08-28 DIAGNOSIS — R42 Dizziness and giddiness: Secondary | ICD-10-CM

## 2023-08-28 LAB — CBC WITH DIFFERENTIAL/PLATELET
Abs Immature Granulocytes: 0.01 10*3/uL (ref 0.00–0.07)
Basophils Absolute: 0 10*3/uL (ref 0.0–0.1)
Basophils Relative: 0 %
Eosinophils Absolute: 0.1 10*3/uL (ref 0.0–0.5)
Eosinophils Relative: 2 %
HCT: 35.6 % — ABNORMAL LOW (ref 36.0–46.0)
Hemoglobin: 11.8 g/dL — ABNORMAL LOW (ref 12.0–15.0)
Immature Granulocytes: 0 %
Lymphocytes Relative: 36 %
Lymphs Abs: 2.7 10*3/uL (ref 0.7–4.0)
MCH: 31.1 pg (ref 26.0–34.0)
MCHC: 33.1 g/dL (ref 30.0–36.0)
MCV: 93.7 fL (ref 80.0–100.0)
Monocytes Absolute: 0.7 10*3/uL (ref 0.1–1.0)
Monocytes Relative: 9 %
Neutro Abs: 4 10*3/uL (ref 1.7–7.7)
Neutrophils Relative %: 53 %
Platelets: 320 10*3/uL (ref 150–400)
RBC: 3.8 MIL/uL — ABNORMAL LOW (ref 3.87–5.11)
RDW: 11.9 % (ref 11.5–15.5)
WBC: 7.5 10*3/uL (ref 4.0–10.5)
nRBC: 0 % (ref 0.0–0.2)

## 2023-08-28 LAB — URINALYSIS, ROUTINE W REFLEX MICROSCOPIC
Bilirubin Urine: NEGATIVE
Glucose, UA: NEGATIVE mg/dL
Hgb urine dipstick: NEGATIVE
Ketones, ur: NEGATIVE mg/dL
Leukocytes,Ua: NEGATIVE
Nitrite: NEGATIVE
Protein, ur: NEGATIVE mg/dL
Specific Gravity, Urine: 1.015 (ref 1.005–1.030)
pH: 6 (ref 5.0–8.0)

## 2023-08-28 LAB — COMPREHENSIVE METABOLIC PANEL WITH GFR
ALT: 28 U/L (ref 0–44)
AST: 21 U/L (ref 15–41)
Albumin: 4.2 g/dL (ref 3.5–5.0)
Alkaline Phosphatase: 85 U/L (ref 38–126)
Anion gap: 9 (ref 5–15)
BUN: 11 mg/dL (ref 6–20)
CO2: 26 mmol/L (ref 22–32)
Calcium: 9.3 mg/dL (ref 8.9–10.3)
Chloride: 103 mmol/L (ref 98–111)
Creatinine, Ser: 0.98 mg/dL (ref 0.44–1.00)
GFR, Estimated: >60 mL/min (ref >60)
Glucose, Bld: 96 mg/dL (ref 70–99)
Potassium: 3.3 mmol/L — ABNORMAL LOW (ref 3.5–5.1)
Sodium: 138 mmol/L (ref 135–145)
Total Bilirubin: 0.5 mg/dL (ref 0.0–1.2)
Total Protein: 7.8 g/dL (ref 6.5–8.1)

## 2023-08-28 LAB — PREGNANCY, URINE: Preg Test, Ur: NEGATIVE

## 2023-08-28 LAB — LIPASE, BLOOD: Lipase: 33 U/L (ref 11–51)

## 2023-08-28 LAB — RESP PANEL BY RT-PCR (RSV, FLU A&B, COVID)  RVPGX2
Influenza A by PCR: NEGATIVE
Influenza B by PCR: NEGATIVE
Resp Syncytial Virus by PCR: NEGATIVE
SARS Coronavirus 2 by RT PCR: NEGATIVE

## 2023-08-28 MED ORDER — ONDANSETRON HCL 4 MG PO TABS: 4.0000 mg | ORAL_TABLET | Freq: Four times a day (QID) | ORAL | 0 refills | Status: AC

## 2023-08-28 MED ORDER — POTASSIUM CHLORIDE 20 MEQ PO PACK
40.0000 meq | PACK | Freq: Once | ORAL | Status: AC
  Administered 2023-08-28: 40 meq via ORAL
  Filled 2023-08-28: qty 2

## 2023-08-28 MED ORDER — ONDANSETRON HCL 4 MG/2ML IJ SOLN
4.0000 mg | Freq: Once | INTRAMUSCULAR | Status: AC
Start: 2023-08-28 — End: 2023-08-28
  Administered 2023-08-28: 4 mg via INTRAVENOUS
  Filled 2023-08-28: qty 2

## 2023-08-28 MED ORDER — IOHEXOL 300 MG/ML  SOLN
100.0000 mL | Freq: Once | INTRAMUSCULAR | Status: AC | PRN
  Administered 2023-08-28: 100 mL via INTRAVENOUS

## 2023-08-28 MED ORDER — LACTATED RINGERS IV BOLUS
1000.0000 mL | Freq: Once | INTRAVENOUS | Status: AC
Start: 2023-08-28 — End: 2023-08-28
  Administered 2023-08-28: 1000 mL via INTRAVENOUS

## 2023-08-28 NOTE — ED Triage Notes (Signed)
 C/o n/v/d w/ stomach cramps since Sunday. Also has had intermittent dizziness. Denies fevers. Pain and dizziness worse after eating.   Hx of H. Pylori

## 2023-08-28 NOTE — ED Provider Notes (Signed)
 [place] [facility] Provider Note   CSN: [id number] Arrival date & time: 08/28/23  1517     History Chief Complaint  Patient presents with   Dizziness    HPI Maria Reid is a 53 y.o. female presenting for generalized malaise. NVD all week. Gets very fatigued and malaised when she tries to get up. Fevers on Sunday. 6 x NBNB diarrhea. Hx of Hpylori infection.  Abdominal pain in her SP area. No urinary symptoms.  Decreased PO to the point of 5 lbs loss. FHX of CKD.  Patient's recorded medical, surgical, social, medication list and allergies were reviewed in the Snapshot window as part of the initial history.   Review of Systems   Review of Systems  Constitutional:  Negative for chills and fever.  HENT:  Negative for ear pain and sore throat.   Eyes:  Negative for pain and visual disturbance.  Respiratory:  Negative for cough and shortness of breath.   Cardiovascular:  Negative for chest pain and palpitations.  Gastrointestinal:  Positive for abdominal pain, diarrhea, nausea and vomiting.  Genitourinary:  Negative for dysuria and hematuria.  Musculoskeletal:  Negative for arthralgias and back pain.  Skin:  Negative for color change and rash.  Neurological:  Negative for seizures and syncope.  All other systems reviewed and are negative.   Physical Exam Updated Vital Signs BP 130/81   Pulse 62   Temp 98.1 F (36.7 C) (Oral)   Resp 14   SpO2 96%  Physical Exam Vitals and nursing note reviewed.  Constitutional:      General: She is not in acute distress.    Appearance: She is well-developed. She is not ill-appearing or toxic-appearing.  HENT:     Head: Normocephalic and atraumatic.  Eyes:     Extraocular Movements: Extraocular movements intact.     Conjunctiva/sclera: Conjunctivae normal.     Pupils: Pupils are equal, round, and reactive to light.  Cardiovascular:     Rate and Rhythm: Normal rate and regular rhythm.      Heart sounds: No murmur heard. Pulmonary:     Effort: Pulmonary effort is normal. No respiratory distress.     Breath sounds: Normal breath sounds.  Abdominal:     General: Abdomen is flat. There is no distension.     Palpations: Abdomen is soft.     Tenderness: There is abdominal tenderness. There is no right CVA tenderness, left CVA tenderness or guarding.  Musculoskeletal:        General: No swelling, tenderness, deformity or signs of injury. Normal range of motion.     Cervical back: Normal range of motion and neck supple. No rigidity.  Skin:    General: Skin is warm and dry.  Neurological:     General: No focal deficit present.     Mental Status: She is alert and oriented to person, place, and time. Mental status is at baseline.     Cranial Nerves: No cranial nerve deficit.  Psychiatric:        Mood and Affect: Mood normal.      ED Course/ Medical Decision Making/ A&P    Procedures Procedures   Medications Ordered in ED Medications  lactated ringers bolus 1,000 mL (0 mLs Intravenous Stopped 08/28/23 1848)  ondansetron (ZOFRAN) injection 4 mg (4 mg Intravenous Given 08/28/23 1750)  iohexol (OMNIPAQUE) 300 MG/ML solution 100 mL (100 mLs Intravenous Contrast Given 08/28/23 2045)  potassium chloride (KLOR-CON) packet 40 mEq (40 mEq  Oral Given 08/28/23 2210)  ondansetron (ZOFRAN) injection 4 mg (4 mg Intravenous Given 08/28/23 2249)  Medical Decision Making:   Maria Reid is a 53 y.o. female who presented to the ED today with abdominal pain, detailed above.    Additional history discussed with patient's family/caregivers.  Patient placed on continuous vitals and telemetry monitoring while in ED which was reviewed periodically.  Complete initial physical exam performed, notably the patient  was HDS In NAD.     Reviewed and confirmed nursing documentation for past medical history, family history, social history.    Initial Assessment:   With the patient's presentation of  abdominal pain, most likely diagnosis is nonspecific etiology. Other diagnoses were considered including (but not limited to) gastroenteritis, colitis, small bowel obstruction, appendicitis, cholecystitis, pancreatitis, nephrolithiasis, UTI, pyleonephritis, ruptured ectopic pregnancy, PID, ovarian torsion. These are considered less likely due to history of present illness and physical exam findings.   This is most consistent with an acute life/limb threatening illness complicated by underlying chronic conditions.   Initial Plan:  CBC/CMP to evaluate for underlying infectious/metabolic etiology for patient's abdominal pain  Lipase to evaluate for pancreatitis  EKG to evaluate for cardiac source of pain  CTAB/Pelvis with contrast to evaluate for structural/surgical etiology of patients' severe abdominal pain.  Urinalysis and repeat physical assessment to evaluate for UTI/Pyelonpehritis  Empiric management of symptoms with escalating pain control and antiemetics as needed.   Initial Study Results:   Laboratory  All laboratory results reviewed without evidence of clinically relevant pathology.    EKG EKG was reviewed independently. Rate, rhythm, axis, intervals all examined and without medically relevant abnormality. ST segments without concerns for elevations.    Radiology All images reviewed independently. Agree with radiology report at this time.   CT ABDOMEN PELVIS W CONTRAST Result Date: 08/28/2023 CLINICAL DATA:  Abdominal pain, acute, nonlocalized EXAM: CT ABDOMEN AND PELVIS WITH CONTRAST TECHNIQUE: Multidetector CT imaging of the abdomen and pelvis was performed using the standard protocol following bolus administration of intravenous contrast. RADIATION DOSE REDUCTION: This exam was performed according to the departmental dose-optimization program which includes automated exposure control, adjustment of the mA and/or kV according to patient size and/or use of iterative reconstruction  technique. CONTRAST:  OMNIPAQUE IOHEXOL 300 MG/ML  SOLN COMPARISON:  None Available. FINDINGS: Lower chest: No acute abnormality. Hepatobiliary: No focal liver abnormality. No gallstones, gallbladder wall thickening, or pericholecystic fluid. No biliary dilatation. Pancreas: No focal lesion. Normal pancreatic contour. No surrounding inflammatory changes. No main pancreatic ductal dilatation. Spleen: Normal in size without focal abnormality.  Splenule noted. Adrenals/Urinary Tract: No adrenal nodule bilaterally. Bilateral kidneys enhance symmetrically. No hydronephrosis. No hydroureter. The urinary bladder is decompressed and grossly unremarkable. Stomach/Bowel: Stomach is within normal limits. No evidence of bowel wall thickening or dilatation. Appendix appears normal. Vascular/Lymphatic: No abdominal aorta or iliac aneurysm. No abdominal, pelvic, or inguinal lymphadenopathy. Reproductive: T-shaped intrauterine device in appropriate position. Uterus and bilateral adnexa are unremarkable. Other: No intraperitoneal free fluid. No intraperitoneal free gas. No organized fluid collection. Musculoskeletal: No abdominal wall hernia or abnormality. No suspicious lytic or blastic osseous lesions. No acute displaced fracture. IMPRESSION: 1. No acute intra-abdominal or intrapelvic abnormality. 2. T-shaped intrauterine device in appropriate position. Electronically Signed   By: Tish Frederickson M.D.   On: 08/28/2023 23:04    Final Reassessment and Plan:   History of present illness physical exam findings show no acute pathology. She is ambulatory tolerating p.o. intake grossly improved after Zofran.  Stable for outpatient care management.  Disposition:  I have considered need for hospitalization, however, considering all of the above, I believe this patient is stable for discharge at this time.  Patient/family educated about specific return precautions for given chief complaint and symptoms.  Patient/family  educated about follow-up with PCP.     Patient/family expressed understanding of return precautions and need for follow-up. Patient spoken to regarding all imaging and laboratory results and appropriate follow up for these results. All education provided in verbal form with additional information in written form. Time was allowed for answering of patient questions. Patient discharged.    Emergency Department Medication Summary:   Medications  lactated ringers bolus 1,000 mL (0 mLs Intravenous Stopped 08/28/23 1848)  ondansetron (ZOFRAN) injection 4 mg (4 mg Intravenous Given 08/28/23 1750)  iohexol (OMNIPAQUE) 300 MG/ML solution 100 mL (100 mLs Intravenous Contrast Given 08/28/23 2045)  potassium chloride (KLOR-CON) packet 40 mEq (40 mEq Oral Given 08/28/23 2210)  ondansetron (ZOFRAN) injection 4 mg (4 mg Intravenous Given 08/28/23 2249)             Clinical Impression:  1. Dizziness   2. Dehydration   3. Nausea and vomiting, unspecified vomiting type      Discharge   Final Clinical Impression(s) / ED Diagnoses Final diagnoses:  Dizziness  Dehydration  Nausea and vomiting, unspecified vomiting type    Rx / DC Orders ED Discharge Orders          Ordered    ondansetron (ZOFRAN) 4 MG tablet  Every 6 hours        08/28/23 2238              Glyn Ade, MD 08/28/23 2318

## 2023-09-17 ENCOUNTER — Ambulatory Visit

## 2023-10-07 ENCOUNTER — Ambulatory Visit
Admission: RE | Admit: 2023-10-07 | Discharge: 2023-10-07 | Disposition: A | Discharge status: Home / Self Care | Source: Ambulatory Visit | Attending: Obstetrics and Gynecology | Admitting: Obstetrics and Gynecology

## 2023-10-07 DIAGNOSIS — Z1231 Encounter for screening mammogram for malignant neoplasm of breast: Secondary | ICD-10-CM
